# Patient Record
Sex: Female | Born: 1935 | Race: Black or African American | Hispanic: No | Smoking: Former smoker
Health system: Southern US, Community
[De-identification: ages and names within clinical notes are randomized; demographics above are authoritative.]

## PROBLEM LIST (undated history)

## (undated) DIAGNOSIS — N183 Chronic kidney disease, stage 3 unspecified: Secondary | ICD-10-CM

## (undated) DIAGNOSIS — R7303 Prediabetes: Secondary | ICD-10-CM

## (undated) DIAGNOSIS — J4520 Mild intermittent asthma, uncomplicated: Secondary | ICD-10-CM

## (undated) DIAGNOSIS — R0600 Dyspnea, unspecified: Secondary | ICD-10-CM

## (undated) DIAGNOSIS — R413 Other amnesia: Secondary | ICD-10-CM

## (undated) DIAGNOSIS — F0390 Unspecified dementia without behavioral disturbance: Secondary | ICD-10-CM

## (undated) DIAGNOSIS — N039 Chronic nephritic syndrome with unspecified morphologic changes: Secondary | ICD-10-CM

## (undated) DIAGNOSIS — M1990 Unspecified osteoarthritis, unspecified site: Secondary | ICD-10-CM

## (undated) DIAGNOSIS — E209 Hypoparathyroidism, unspecified: Secondary | ICD-10-CM

## (undated) DIAGNOSIS — E785 Hyperlipidemia, unspecified: Secondary | ICD-10-CM

## (undated) DIAGNOSIS — E559 Vitamin D deficiency, unspecified: Secondary | ICD-10-CM

## (undated) DIAGNOSIS — I10 Essential (primary) hypertension: Secondary | ICD-10-CM

## (undated) DIAGNOSIS — Z87442 Personal history of urinary calculi: Secondary | ICD-10-CM

## (undated) HISTORY — DX: Prediabetes: R73.03

## (undated) HISTORY — DX: Chronic nephritic syndrome with unspecified morphologic changes: N03.9

## (undated) HISTORY — PX: REPLACEMENT TOTAL KNEE: SUR1224

## (undated) HISTORY — DX: Hyperlipidemia, unspecified: E78.5

## (undated) HISTORY — DX: Mild intermittent asthma, uncomplicated: J45.20

## (undated) HISTORY — DX: Vitamin D deficiency, unspecified: E55.9

## (undated) HISTORY — PX: EYE SURGERY: SHX253

## (undated) HISTORY — DX: Essential (primary) hypertension: I10

## (undated) HISTORY — DX: Hypoparathyroidism, unspecified: E20.9

## (undated) HISTORY — PX: PARTIAL HYSTERECTOMY: SHX80

## (undated) HISTORY — DX: Other amnesia: R41.3

## (undated) HISTORY — DX: Chronic kidney disease, stage 3 unspecified: N18.30

## (undated) HISTORY — DX: Chronic kidney disease, stage 3 (moderate): N18.3

---

## 2017-10-26 LAB — HEMOGLOBIN A1C: Hemoglobin A1C: 5.9

## 2017-12-28 LAB — MICROALBUMIN/CREATININE RATIO, UR
Creatinine, Ur: 72 mg/dL
Microalb Creat Ratio: 418
Microalbumin, Urine: 30.1

## 2017-12-28 LAB — COMPREHENSIVE METABOLIC PANEL
ALT: 12 (ref 3–30)
AST: 16
Albumin, Serum: 4
Albumin/Globulin Ratio: 1.3
Alkaline Phosphatase, S: 73
BUN: 15 (ref 4–21)
Bilirubin: 0.3
Calcium: 8.9
Carbon Dioxide, Total: 27
Chloride: 107
EGFR (African American): 38
Globulin, Total: 3.1
Glucose: 95
Potassium: 4.1
Protein: 7.1
Sodium: 143

## 2017-12-28 LAB — LIPID PANEL
Chol/HDL Ratio, serum: 3
Cholesterol, Total: 117
HDL Cholesterol: 39 (ref 35–70)
LDL Cholesterol (Calc): 55
TRIGLYCERIDES: 149 (ref 40–160)

## 2017-12-28 LAB — VITAMIN D 25-HYDROXY, D2 + D3: Vit D, 25-Hydroxy: 28

## 2017-12-28 LAB — MICROALBUMIN, URINE: Microalb, Ur: 30.1

## 2018-05-16 LAB — BASIC METABOLIC PANEL
BUN/Creatinine Ratio: 11
BUN: 22 — AB (ref 4–21)
Calcium: 9
Carbon Dioxide, Total: 27
Chloride: 101
Creatine, Serum: 1.93
EGFR (African American): 27
Glucose: 175
Potassium: 3.8
Sodium: 138

## 2018-05-29 ENCOUNTER — Encounter: Payer: Self-pay | Admitting: Family Medicine

## 2018-05-29 ENCOUNTER — Ambulatory Visit (INDEPENDENT_AMBULATORY_CARE_PROVIDER_SITE_OTHER): Payer: Medicare Other | Admitting: Family Medicine

## 2018-05-29 VITALS — BP 119/56 | HR 63 | Resp 17 | Ht 60.0 in | Wt 173.4 lb

## 2018-05-29 DIAGNOSIS — Z7689 Persons encountering health services in other specified circumstances: Secondary | ICD-10-CM

## 2018-05-29 DIAGNOSIS — R350 Frequency of micturition: Secondary | ICD-10-CM | POA: Diagnosis not present

## 2018-05-29 DIAGNOSIS — F0390 Unspecified dementia without behavioral disturbance: Secondary | ICD-10-CM

## 2018-05-29 DIAGNOSIS — I10 Essential (primary) hypertension: Secondary | ICD-10-CM | POA: Diagnosis not present

## 2018-05-29 LAB — POCT URINALYSIS DIP (CLINITEK)
Bilirubin, UA: NEGATIVE
Glucose, UA: NEGATIVE mg/dL
NITRITE UA: NEGATIVE
POC PROTEIN,UA: 100 — AB
RBC UA: NEGATIVE
Spec Grav, UA: 1.02 (ref 1.010–1.025)
Urobilinogen, UA: 1 E.U./dL
pH, UA: 6 (ref 5.0–8.0)

## 2018-05-29 NOTE — Patient Instructions (Addendum)
Thank you for choosing Primary Care at [facility] to be your medical home!    Glenda Garcia was seen by Molli Barrows, FNP today.   Glenda Garcia's primary care provider is Scot Jun, FNP.   For the best care possible, you should try to see Molli Barrows, FNP-C whenever you come to the clinic.   We look forward to seeing you again soon!  If you have any questions about your visit today, please call us at [phone] or feel free to reach your primary care provider via [place].     Discontinue lisinopril given new onset tongue swelling and dry cough. Continue all other medications as prescribed. Notify me here at the office of blood pressure increases to greater than 140/90.  I will see back in office in 2 weeks.

## 2018-05-29 NOTE — Progress Notes (Signed)
New Patient Office Visit  Subjective:  Patient ID: Glenda Garcia, female    DOB: 29-May-1934  Age: 84 y.o. MRN: [id number]  CC:  Chief Complaint  Patient presents with  . Establish Care  . Urinary Frequency    daughter has noticed urinary frequency since moving here. patient denies any pain, odor, nausea, vomiting    HPI Glenda Garcia presents to establish care.  Patient was a walk-in today records accessible to be regarding her previous medical history.  She is accompanied today by her daughter in law reports patient has a history of Alzheimer's, kidney disease, cardiac conditions, high blood pressure, and hyperlipidemia.  She recently located to [facility] and was followed in our clinic in her previous home town in [place].  She has requested medical records to be sent to this office. She is concerned today that her mother's blood pressure has been running rather low and she is currently prescribed to blood pressure medications lisinopril being the newest medication added.  She is also concerned as her mother has had increased urinary frequency and is requesting evaluation of UTI.  Daughter is employed restoring immunoblot mother's medical condition as she is only given to take over her care within the last couple weeks since her mother moved in with her here to [place].  Patient is also a poor historian given cognitive impairment.  Patient denies chest pain, shortness of breath, headache, weakness, productive cough, unintentional weight gain, dysuria, or CVA tenderness  Past Medical History:  Diagnosis Date  . Hyperlipidemia   . Hypertension   . Memory loss     Unable to obtain family history as daughter-in-law at today's visit and due to cognitive decline patient is unable to report family medical. Social History   Socioeconomic History  . Marital status: Widowed    Spouse name: Not on file  . Number of children: Not on file  . Years of education: Not on file   . Highest education level: Not on file  Occupational History  . Not on file  Social Needs  . Financial resource strain: Not on file  . Food insecurity:    Worry: Not on file    Inability: Not on file  . Transportation needs:    Medical: Not on file    Non-medical: Not on file  Tobacco Use  . Smoking status: Former Research scientist (life sciences)  . Smokeless tobacco: Never Used  Substance and Sexual Activity  . Alcohol use: Not Currently  . Drug use: Never  . Sexual activity: Not Currently  Lifestyle  . Physical activity:    Days per week: Not on file    Minutes per session: Not on file  . Stress: Not on file  Relationships  . Social connections:    Talks on phone: Not on file    Gets together: Not on file    Attends religious service: Not on file    Active member of club or organization: Not on file    Attends meetings of clubs or organizations: Not on file    Relationship status: Not on file  . Intimate partner violence:    Fear of current or ex partner: Not on file    Emotionally abused: Not on file    Physically abused: Not on file    Forced sexual activity: Not on file  Other Topics Concern  . Not on file  Social History Narrative  . Not on file    ROS Review of Systems Pertinent negatives indicated  in HPI Objective:   Today's Vitals: BP (!) 119/56   Pulse 63   Resp 17   Ht 5' (1.524 m)   Wt 173 lb 6.4 oz (78.7 kg)   SpO2 95%   BMI 33.86 kg/m   Physical Exam General appearance: alert, well developed, well nourished, cooperative and in no distress Head: Normocephalic, without obvious abnormality, atraumatic Respiratory: Respirations even and unlabored, normal respiratory rate Extremities: No gross deformities, gait intact, strength intact 5/5 BUE and BLE  Skin: Skin color, texture, turgor normal. No rashes seen  Psych: Appropriate mood and affect. Neurologic: Mental status: Alert, oriented to person, place, and time, thought content appropriate. Assessment & Plan:    Problem List Items Addressed This Visit    None    Visit Diagnoses    Encounter to establish care    -  Primary   Urine frequency    will check a UA to rule out UTI.   Relevant Orders   POCT URINALYSIS DIP (CLINITEK) (Completed)   Urine Culture (Completed)   Dementia without behavioral disturbance, unspecified dementia type ([place])       Scheduled to follow-up with neurology in 1 month  relevant Medications   donepezil (ARICEPT ODT) 5 MG disintegrating tablet   Essential hypertension   , controlled today. Family reports the blood pressure has ran low with home readings.  Will temporarily DC lisinopril until previous medical records are received and reviewed.  Advised family to continue monitoring blood pressure at home.   Relevant Medications   furosemide (LASIX) 40 MG tablet   simvastatin (ZOCOR) 10 MG tablet   metoprolol tartrate (LOPRESSOR) 25 MG tablet      Outpatient Encounter Medications as of 05/29/2018  Medication Sig  . donepezil (ARICEPT ODT) 5 MG disintegrating tablet Take 1 tablet by mouth at bedtime.  . furosemide (LASIX) 40 MG tablet Take 20 mg by mouth every morning.  . metoprolol tartrate (LOPRESSOR) 25 MG tablet Take 25 mg by mouth daily.  . simvastatin (ZOCOR) 10 MG tablet Take 10 mg by mouth every evening.  . [DISCONTINUED] lisinopril (PRINIVIL,ZESTRIL) 5 MG tablet Take 5 mg by mouth daily.   No facility-administered encounter medications on file as of 05/29/2018.     Patient and family member advised to please make efforts to obtain medical records prior to scheduled follow-up so that appropriate medical decision making can be made.  Patient will need referrals to specialty she already has a follow-up an appointment scheduled with Dr. Carlyon Shadow.  Will review medical records and place referral after records have been received and reviewed.  Follow-up: Return in about 2 weeks (around 06/12/2018) for follow-up on medical condition after review of medical records .   Molli Barrows, FNP Primary Care at [facility] [address] [place] [phone]: [phone]

## 2018-05-31 LAB — URINE CULTURE

## 2018-06-01 ENCOUNTER — Telehealth: Payer: Self-pay | Admitting: Family Medicine

## 2018-06-01 NOTE — Telephone Encounter (Signed)
Please advise patient/patient daughter that I need to review medical records labs etc. Prior to placing a  Referral as I must have a history attached to the referral.   Molli Barrows, FNP Primary Care at [facility] [address], [place] [phone]: [phone]

## 2018-06-01 NOTE — Telephone Encounter (Signed)
Please advise 

## 2018-06-01 NOTE — Telephone Encounter (Signed)
Daughter called requesting a referral for her mother to see a nephrologist, stated the one she normally sees is too far. Please follow up

## 2018-06-04 DIAGNOSIS — I10 Essential (primary) hypertension: Secondary | ICD-10-CM | POA: Insufficient documentation

## 2018-06-04 DIAGNOSIS — F0390 Unspecified dementia without behavioral disturbance: Secondary | ICD-10-CM | POA: Insufficient documentation

## 2018-06-12 ENCOUNTER — Encounter: Payer: Self-pay | Admitting: Family Medicine

## 2018-06-12 ENCOUNTER — Ambulatory Visit (INDEPENDENT_AMBULATORY_CARE_PROVIDER_SITE_OTHER): Payer: Medicare Other | Admitting: Family Medicine

## 2018-06-12 VITALS — BP 149/70 | HR 67 | Resp 17 | Ht 60.0 in | Wt 175.8 lb

## 2018-06-12 DIAGNOSIS — Z1321 Encounter for screening for nutritional disorder: Secondary | ICD-10-CM

## 2018-06-12 DIAGNOSIS — I10 Essential (primary) hypertension: Secondary | ICD-10-CM

## 2018-06-12 DIAGNOSIS — N184 Chronic kidney disease, stage 4 (severe): Secondary | ICD-10-CM | POA: Diagnosis not present

## 2018-06-12 DIAGNOSIS — I129 Hypertensive chronic kidney disease with stage 1 through stage 4 chronic kidney disease, or unspecified chronic kidney disease: Secondary | ICD-10-CM

## 2018-06-12 DIAGNOSIS — E559 Vitamin D deficiency, unspecified: Secondary | ICD-10-CM | POA: Diagnosis not present

## 2018-06-12 DIAGNOSIS — E7841 Elevated Lipoprotein(a): Secondary | ICD-10-CM

## 2018-06-12 DIAGNOSIS — R7303 Prediabetes: Secondary | ICD-10-CM | POA: Diagnosis not present

## 2018-06-12 MED ORDER — ALBUTEROL SULFATE HFA 108 (90 BASE) MCG/ACT IN AERS: 2.0000 | INHALATION_SPRAY | Freq: Four times a day (QID) | RESPIRATORY_TRACT | 0 refills | Status: DC | PRN

## 2018-06-12 MED ORDER — SIMVASTATIN 10 MG PO TABS: 10.0000 mg | ORAL_TABLET | Freq: Every day | ORAL | 1 refills | Status: DC

## 2018-06-12 MED ORDER — FUROSEMIDE 40 MG PO TABS: 20.0000 mg | ORAL_TABLET | Freq: Every morning | ORAL | 5 refills | Status: DC

## 2018-06-12 MED ORDER — AMLODIPINE BESYLATE 2.5 MG PO TABS: 2.5000 mg | ORAL_TABLET | Freq: Every day | ORAL | 3 refills | Status: DC

## 2018-06-12 NOTE — Progress Notes (Signed)
Established Patient Office Visit  Subjective:  Patient ID: Glenda Garcia, female    DOB: [birth year]  Age: 84 y.o. MRN: [id number]  CC:  Chief Complaint  Patient presents with  . Medical Management of Chronic Issues    HPI Glenda Garcia presents for evaluation of chronic condition. Patient was seen and established two weeks prior. She did not have medical records available with her during that visit. She returns today to follow-up.  During last office visit, family was concerned regarding patient's low blood pressure readings.  During her last office visit with her previous PCP lisinopril was added to her regimen.  Lisinopril was this continued at her last office visit and today she presents with an elevated blood pressure reading although has not taken any blood pressure medications today.  Of more concern her heart rate remains to cardiac without the metoprolol she is prescribed for high blood pressure. She is in need of a nephrology referral. She has been followed nephrology in [facility] for CKD 3. Her most recent lab results indicate a creatinine level of 1.93 and a BUN of 22.  GFR was 27.   She suffers from a cognitive decline with the memory deficit and recently was notified that she was no longer able to drive.  She was taking donepezil however recently stopped taking the medication due to some irritability.  Patient has a follow-up scheduled with neurology next month defer to them to resume medication.  Patient is cognitively stable  and asking and answering questions appropriately during today's visit.  Past Medical History:  Diagnosis Date  . CKD (chronic kidney disease), stage III ([place])   . Glomerulonephritis, chronic   . Hyperlipidemia   . Hypertension   . Hypoparathyroidism ([place])   . Memory loss   . Mild intermittent asthma   . Prediabetes   . Vitamin D deficiency      Family History  Problem Relation Age of Onset  . Hypertension Mother   .  Cancer Father   . Healthy Sister   . Healthy Son   . Early death Son        MVA  . Unexplained death Brother   . Unexplained death Brother   . Early death Sister        died at 47 months    Social History   Socioeconomic History  . Marital status: Widowed    Spouse name: Not on file  . Number of children: Not on file  . Years of education: Not on file  . Highest education level: Not on file  Occupational History  . Not on file  Social Needs  . Financial resource strain: Not on file  . Food insecurity:    Worry: Not on file    Inability: Not on file  . Transportation needs:    Medical: Not on file    Non-medical: Not on file  Tobacco Use  . Smoking status: Former Research scientist (life sciences)  . Smokeless tobacco: Former Systems developer    Types: Snuff  Substance and Sexual Activity  . Alcohol use: Not Currently  . Drug use: Never  . Sexual activity: Not Currently  Lifestyle  . Physical activity:    Days per week: Not on file    Minutes per session: Not on file  . Stress: Not on file  Relationships  . Social connections:    Talks on phone: Not on file    Gets together: Not on file    Attends religious service: Not  on file    Active member of club or organization: Not on file    Attends meetings of clubs or organizations: Not on file    Relationship status: Not on file  . Intimate partner violence:    Fear of current or ex partner: Not on file    Emotionally abused: Not on file    Physically abused: Not on file    Forced sexual activity: Not on file  Other Topics Concern  . Not on file  Social History Narrative  . Not on file    Outpatient Medications Prior to Visit  Medication Sig Dispense Refill  . furosemide (LASIX) 40 MG tablet Take 20 mg by mouth every morning.  5  . metoprolol tartrate (LOPRESSOR) 25 MG tablet Take 25 mg by mouth daily.    . simvastatin (ZOCOR) 10 MG tablet Take 10 mg by mouth every evening.  4  . donepezil (ARICEPT ODT) 5 MG disintegrating tablet Take 1 tablet by  mouth at bedtime.  5   No facility-administered medications prior to visit.     No Known Allergies  ROS Review of Systems Pertinent negatives listed in HPI   Objective:    Physical Exam  BP (!) 159/75   Pulse (!) 54   Resp 17   Ht 5' (1.524 m)   Wt 175 lb 12.8 oz (79.7 kg)   SpO2 95%   BMI 34.33 kg/m  Wt Readings from Last 3 Encounters:  06/12/18 175 lb 12.8 oz (79.7 kg)  05/29/18 173 lb 6.4 oz (78.7 kg)    Physical Exam: Constitutional: Patient appears well-developed and well-nourished. No distress. HENT: Normocephalic, atraumatic, External right and left ear normal. Oropharynx is clear and moist.  Eyes: Conjunctivae and EOM are normal. PERRLA, no scleral icterus. Neck: Normal ROM. Neck supple. No JVD. No tracheal deviation. No thyromegaly. CVS: Bradycardia,  S1/S2 +, no murmurs, no gallops, no carotid bruit.  Pulmonary: Effort and breath sounds normal, no stridor, rhonchi, wheezes, rales.  Abdominal: Soft. BS +, no distension, tenderness, rebound or guarding.  Musculoskeletal: Normal range of motion. No edema and no tenderness.  Neuro: Alert. Normal reflexes, muscle tone coordination. No cranial nerve deficit. Skin: Skin is warm and dry. No rash noted. Not diaphoretic. No erythema. No pallor. Psychiatric: Normal mood and affect. Behavior, judgment, thought content normal.   Health Maintenance Due  Topic Date Due  . TETANUS/TDAP  01/21/1955  . DEXA SCAN  01/20/2001  . PNA vac Low Risk Adult (1 of 2 - PCV13) 01/20/2001    There are no preventive care reminders to display for this patient.  No results found for: TSH No results found for: WBC, HGB, HCT, MCV, PLT Lab Results  Component Value Date   NA 138 05/16/2018   K 3.8 05/16/2018   CO2 27 05/16/2018   BUN 22 (A) 05/16/2018   ALKPHOS 73 12/28/2017   AST 16 12/28/2017   ALT 12 12/28/2017   ALBUMIN 4.0 12/28/2017   CALCIUM 9.0 05/16/2018   Lab Results  Component Value Date   CHOL 117 12/28/2017    Lab Results  Component Value Date   HDL 39 12/28/2017   Lab Results  Component Value Date   LDLCALC 55 12/28/2017   Lab Results  Component Value Date   TRIG 149 12/28/2017   Lab Results  Component Value Date   CHOLHDL 3.0 12/28/2017   Lab Results  Component Value Date   HGBA1C 5.9 10/26/2017      Assessment &  Plan:  1. Essential hypertension, elevated today however have not taken home meds. Given heart rate today without medication I am discontinuing metoprolol as there is good options available for blood pressure management.  Will start amlodipine 2.5 mg once daily. We will have patient follow-up in 3 weeks for blood pressure recheck and pulse check. Continue to encourage to minimize intake of sodium as this impacts blood pressure.  2. Elevated lipoprotein(a) Checking a fasting lipid panel today along with a TSH level.  3. Vitamin D deficiency Patient had a previously low vitamin D level this level has not been rechecked is completing weekly iron replacement therapy. - Vitamin D, 25-hydroxy  4. Prediabetes,  Last A1c was 5.9 in July.  We will recheck a A1c today.  Has remained stable per previous medical records without medication. Checking the following - Hemoglobin T6R - Basic metabolic panel  5. CKD stage 4 secondary to hypertension [facility]) Patient has a history of some chronic anemia likely secondary to chronic disease we will recheck a CBC today. Referral placed to nephrology - Ambulatory referral to Nephrology   Meds ordered this encounter  Medications  . amLODipine (NORVASC) 2.5 MG tablet    Sig: Take 1 tablet (2.5 mg total) by mouth daily.    Dispense:  90 tablet    Refill:  3  . albuterol (PROVENTIL HFA;VENTOLIN HFA) 108 (90 Base) MCG/ACT inhaler    Sig: Inhale 2 puffs into the lungs every 6 (six) hours as needed for wheezing or shortness of breath.    Dispense:  1 Inhaler    Refill:  0  . simvastatin (ZOCOR) 10 MG tablet    Sig: Take 1 tablet (10  mg total) by mouth daily at 6 PM.    Dispense:  90 tablet    Refill:  1  . furosemide (LASIX) 40 MG tablet    Sig: Take 0.5 tablets (20 mg total) by mouth every morning.    Dispense:  30 tablet    Refill:  5    Follow-up: Return 3 weeks BP and pulse check and 2 months with me .    Molli Barrows, FNP

## 2018-06-12 NOTE — Patient Instructions (Addendum)
Review medication list attached and take all medications as prescribed today.   -I am discontinuing metoprolol due to your pulse rate is low.  You have hypertension, therefore medication is needed to control your blood pressure.  I am starting you on Amlodipine 2.5 mg once daily. Continue Lasix 20 mg once daily.  Return in 3 weeks for blood pressure check.   I have refilled your prescription for albuterol in the event you need an inhaler.  I will allow neurology to evaluate the need for Donepezil therefore if she is not taking, do not resume.    Hypertension Hypertension is another name for high blood pressure. High blood pressure forces your heart to work harder to pump blood. This can cause problems over time. There are two numbers in a blood pressure reading. There is a top number (systolic) over a bottom number (diastolic). It is best to have a blood pressure below 120/80. Healthy choices can help lower your blood pressure. You may need medicine to help lower your blood pressure if:  Your blood pressure cannot be lowered with healthy choices.  Your blood pressure is higher than 130/80.  Follow these instructions at home: Eating and drinking  If directed, follow the DASH eating plan. This diet includes: ? Filling half of your plate at each meal with fruits and vegetables. ? Filling one quarter of your plate at each meal with whole grains. Whole grains include whole wheat pasta, brown rice, and whole grain bread. ? Eating or drinking low-fat dairy products, such as skim milk or low-fat yogurt. ? Filling one quarter of your plate at each meal with low-fat (lean) proteins. Low-fat proteins include fish, skinless chicken, eggs, beans, and tofu. ? Avoiding fatty meat, cured and processed meat, or chicken with skin. ? Avoiding premade or processed food.  Eat less than 1,500 mg of salt (sodium) a day.  Limit alcohol use to no more than 1 drink a day for nonpregnant women and 2 drinks  a day for men. One drink equals 12 oz of beer, 5 oz of wine, or 1 oz of hard liquor. Lifestyle  Work with your doctor to stay at a healthy weight or to lose weight. Ask your doctor what the best weight is for you.  Get at least 30 minutes of exercise that causes your heart to beat faster (aerobic exercise) most days of the week. This may include walking, swimming, or biking.  Get at least 30 minutes of exercise that strengthens your muscles (resistance exercise) at least 3 days a week. This may include lifting weights or pilates.  Do not use any products that contain nicotine or tobacco. This includes cigarettes and e-cigarettes. If you need help quitting, ask your doctor.  Check your blood pressure at home as told by your doctor.  Keep all follow-up visits as told by your doctor. This is important. Medicines  Take over-the-counter and prescription medicines only as told by your doctor. Follow directions carefully.  Do not skip doses of blood pressure medicine. The medicine does not work as well if you skip doses. Skipping doses also puts you at risk for problems.  Ask your doctor about side effects or reactions to medicines that you should watch for. Contact a doctor if:  You think you are having a reaction to the medicine you are taking.  You have headaches that keep coming back (recurring).  You feel dizzy.  You have swelling in your ankles.  You have trouble with your vision. Get help  right away if:  You get a very bad headache.  You start to feel confused.  You feel weak or numb.  You feel faint.  You get very bad pain in your: ? Chest. ? Belly (abdomen).  You throw up (vomit) more than once.  You have trouble breathing. Summary  Hypertension is another name for high blood pressure.  Making healthy choices can help lower blood pressure. If your blood pressure cannot be controlled with healthy choices, you may need to take medicine. This information is not  intended to replace advice given to you by your health care provider. Make sure you discuss any questions you have with your health care provider. Document Released: 12/01/2007 Document Revised: 05/12/2016 Document Reviewed: 05/12/2016 Elsevier Interactive Patient Education  [employer].

## 2018-06-13 LAB — CBC WITH DIFFERENTIAL/PLATELET
Basophils Absolute: 0.1 10*3/uL (ref 0.0–0.2)
Basos: 1 %
EOS (ABSOLUTE): 0.2 10*3/uL (ref 0.0–0.4)
Eos: 2 %
Hematocrit: 44.3 % (ref 34.0–46.6)
Hemoglobin: 14.5 g/dL (ref 11.1–15.9)
Immature Grans (Abs): 0 10*3/uL (ref 0.0–0.1)
Immature Granulocytes: 0 %
Lymphocytes Absolute: 2.3 10*3/uL (ref 0.7–3.1)
Lymphs: 25 %
MCH: 29.5 pg (ref 26.6–33.0)
MCHC: 32.7 g/dL (ref 31.5–35.7)
MCV: 90 fL (ref 79–97)
Monocytes Absolute: 0.5 10*3/uL (ref 0.1–0.9)
Monocytes: 5 %
Neutrophils Absolute: 6.2 10*3/uL (ref 1.4–7.0)
Neutrophils: 67 %
Platelets: 215 10*3/uL (ref 150–450)
RBC: 4.92 x10E6/uL (ref 3.77–5.28)
RDW: 13.4 % (ref 12.3–15.4)
WBC: 9.3 10*3/uL (ref 3.4–10.8)

## 2018-06-13 LAB — LIPID PANEL
CHOLESTEROL TOTAL: 203 mg/dL — AB (ref 100–199)
Chol/HDL Ratio: 4.2 ratio (ref 0.0–4.4)
HDL: 48 mg/dL (ref >39)
LDL Calculated: 102 mg/dL — ABNORMAL HIGH (ref 0–99)
Triglycerides: 265 mg/dL — ABNORMAL HIGH (ref 0–149)
VLDL Cholesterol Cal: 53 mg/dL — ABNORMAL HIGH (ref 5–40)

## 2018-06-13 LAB — BASIC METABOLIC PANEL
BUN/Creatinine Ratio: 17 (ref 12–28)
BUN: 33 mg/dL — ABNORMAL HIGH (ref 8–27)
CO2: 20 mmol/L (ref 20–29)
Calcium: 10 mg/dL (ref 8.7–10.3)
Chloride: 100 mmol/L (ref 96–106)
Creatinine, Ser: 1.91 mg/dL — ABNORMAL HIGH (ref 0.57–1.00)
GFR calc Af Amer: 28 mL/min/{1.73_m2} — ABNORMAL LOW (ref >59)
GFR calc non Af Amer: 24 mL/min/{1.73_m2} — ABNORMAL LOW (ref >59)
Glucose: 93 mg/dL (ref 65–99)
Potassium: 5 mmol/L (ref 3.5–5.2)
Sodium: 141 mmol/L (ref 134–144)

## 2018-06-13 LAB — HEMOGLOBIN A1C
Est. average glucose Bld gHb Est-mCnc: 128 mg/dL
Hgb A1c MFr Bld: 6.1 % — ABNORMAL HIGH (ref 4.8–5.6)

## 2018-06-13 LAB — VITAMIN D 25 HYDROXY (VIT D DEFICIENCY, FRACTURES): Vit D, 25-Hydroxy: 19.1 ng/mL — ABNORMAL LOW (ref 30.0–100.0)

## 2018-06-13 LAB — TSH: TSH: 0.758 u[IU]/mL (ref 0.450–4.500)

## 2018-06-14 MED ORDER — VITAMIN D (ERGOCALCIFEROL) 1.25 MG (50000 UNIT) PO CAPS: 50000.0000 [IU] | ORAL_CAPSULE | ORAL | 0 refills | Status: DC

## 2018-06-14 NOTE — Addendum Note (Signed)
Addended by: Scot Jun on: 06/14/2018 12:36 PM   Modules accepted: Orders

## 2018-06-14 NOTE — Progress Notes (Signed)
Patient's daughter notified of results & recommendations. Expressed understanding. States that she has restarted patient on her Statin medication. Patient wasn't taking all of her medications or eating properly prior to relocating. I also updated chart with their local pharmacy so you can send the Vitamin D Rx over.

## 2018-07-03 ENCOUNTER — Ambulatory Visit (INDEPENDENT_AMBULATORY_CARE_PROVIDER_SITE_OTHER): Payer: Medicare Other

## 2018-07-03 VITALS — BP 122/68 | HR 82

## 2018-07-03 DIAGNOSIS — I10 Essential (primary) hypertension: Secondary | ICD-10-CM | POA: Diagnosis not present

## 2018-07-03 DIAGNOSIS — Z0130 Encounter for examination of blood pressure without abnormal findings: Secondary | ICD-10-CM | POA: Diagnosis not present

## 2018-07-03 NOTE — Progress Notes (Signed)
Patient here for BP check. BP 122/68. Pulse 82. Spoke with provider & she states to continue current medications & keep scheduled appointment in February. KWalker, CMA.

## 2018-07-24 ENCOUNTER — Telehealth: Payer: Self-pay | Admitting: Diagnostic Neuroimaging

## 2018-07-24 ENCOUNTER — Encounter: Payer: Self-pay | Admitting: Diagnostic Neuroimaging

## 2018-07-24 ENCOUNTER — Ambulatory Visit (INDEPENDENT_AMBULATORY_CARE_PROVIDER_SITE_OTHER): Payer: Medicare Other | Admitting: Diagnostic Neuroimaging

## 2018-07-24 VITALS — BP 139/90 | HR 116 | Ht 60.0 in | Wt 181.0 lb

## 2018-07-24 DIAGNOSIS — R413 Other amnesia: Secondary | ICD-10-CM

## 2018-07-24 DIAGNOSIS — F0390 Unspecified dementia without behavioral disturbance: Secondary | ICD-10-CM | POA: Diagnosis not present

## 2018-07-24 DIAGNOSIS — F03A0 Unspecified dementia, mild, without behavioral disturbance, psychotic disturbance, mood disturbance, and anxiety: Secondary | ICD-10-CM

## 2018-07-24 NOTE — Telephone Encounter (Signed)
Medicare order sent to GI pt daughter in law is aware on dpr.

## 2018-07-24 NOTE — Progress Notes (Signed)
[facility]  PATIENT: Glenda Garcia DOB: 1933/08/05  REFERRING CLINICIAN: C Boehman HISTORY FROM: patient and daughter in law REASON FOR VISIT: new consult    HISTORICAL  CHIEF COMPLAINT:  Chief Complaint  Patient presents with  . New Patient (Initial Visit)    Referred by NP Virgina Evener  . Memory Loss    Rm 7, daughter in law, Health visitor. Given aricept, but was not able to take due to SE    HISTORY OF PRESENT ILLNESS:   85 year old female here for evaluation of memory loss.  Patient was living in [facility], independently, when her PCP noted some memory loss problems.  She contacted family lives in [place] that they should, and take care of her.  Mild dementia was suspected.  Patient moved to [facility] in October 2019.  Family has noted additional short-term memory loss problems.  Patient has had significant decline in activities of daily living.  She needs assistance with shopping, transportation, bills, medications.  She is not able to function independently outside of the home.  Patient tried on donepezil but family noted increasing mood changes and lability, and therefore this medication was stopped.   REVIEW OF SYSTEMS: Full 14 system review of systems performed and negative with exception of: Memory loss confusion feeling cold shortness of breath.  ALLERGIES: No Known Allergies  HOME MEDICATIONS: Outpatient Medications Prior to Visit  Medication Sig Dispense Refill  . albuterol (PROVENTIL HFA;VENTOLIN HFA) 108 (90 Base) MCG/ACT inhaler Inhale 2 puffs into the lungs every 6 (six) hours as needed for wheezing or shortness of breath. 1 Inhaler 0  . amLODipine (NORVASC) 2.5 MG tablet Take 1 tablet (2.5 mg total) by mouth daily. 90 tablet 3  . furosemide (LASIX) 40 MG tablet Take 0.5 tablets (20 mg total) by mouth every morning. 30 tablet 5  . simvastatin (ZOCOR) 10 MG tablet Take 1 tablet (10 mg total) by mouth daily at 6 PM. 90  tablet 1  . Vitamin D, Ergocalciferol, (DRISDOL) 1.25 MG (50000 UT) CAPS capsule Take 1 capsule (50,000 Units total) by mouth every 7 (seven) days. 12 capsule 0  . donepezil (ARICEPT ODT) 5 MG disintegrating tablet Take 1 tablet by mouth at bedtime.  5   No facility-administered medications prior to visit.     PAST MEDICAL HISTORY: Past Medical History:  Diagnosis Date  . CKD (chronic kidney disease), stage III ([place])   . Glomerulonephritis, chronic   . Hyperlipidemia   . Hypertension   . Hypoparathyroidism ([place])   . Memory loss   . Mild intermittent asthma   . Prediabetes   . Vitamin D deficiency     PAST SURGICAL HISTORY: Past Surgical History:  Procedure Laterality Date  . PARTIAL HYSTERECTOMY    . REPLACEMENT TOTAL KNEE Left     FAMILY HISTORY: Family History  Problem Relation Age of Onset  . Hypertension Mother   . Cancer Father   . Healthy Sister   . Healthy Son   . Early death Son        MVA  . Unexplained death Brother   . Unexplained death Brother   . Early death Sister        died at 41 months    SOCIAL HISTORY: Social History   Socioeconomic History  . Marital status: Widowed    Spouse name: Not on file  . Number of children: Not on file  . Years of education: Not on file  . Highest education level: Not  on file  Occupational History  . Not on file  Social Needs  . Financial resource strain: Not on file  . Food insecurity:    Worry: Not on file    Inability: Not on file  . Transportation needs:    Medical: Not on file    Non-medical: Not on file  Tobacco Use  . Smoking status: Former Research scientist (life sciences)  . Smokeless tobacco: Former Systems developer    Types: Snuff  Substance and Sexual Activity  . Alcohol use: Not Currently  . Drug use: Never  . Sexual activity: Not Currently  Lifestyle  . Physical activity:    Days per week: Not on file    Minutes per session: Not on file  . Stress: Not on file  Relationships  . Social connections:    Talks on phone: Not  on file    Gets together: Not on file    Attends religious service: Not on file    Active member of club or organization: Not on file    Attends meetings of clubs or organizations: Not on file    Relationship status: Not on file  . Intimate partner violence:    Fear of current or ex partner: Not on file    Emotionally abused: Not on file    Physically abused: Not on file    Forced sexual activity: Not on file  Other Topics Concern  . Not on file  Social History Narrative  . Not on file     PHYSICAL EXAM  GENERAL EXAM/CONSTITUTIONAL: Vitals:  Vitals:   07/24/18 1002  BP: 139/90  Pulse: (!) 116  Weight: 181 lb (82.1 kg)  Height: 5' (1.524 m)     Body mass index is 35.35 kg/m. Wt Readings from Last 3 Encounters:  07/24/18 181 lb (82.1 kg)  06/12/18 175 lb 12.8 oz (79.7 kg)  05/29/18 173 lb 6.4 oz (78.7 kg)     Patient is in no distress; well developed, nourished and groomed; neck is supple  CARDIOVASCULAR:  Examination of carotid arteries is normal; no carotid bruits  Regular rate and rhythm, no murmurs  Examination of peripheral vascular system by observation and palpation is normal  EYES:  Ophthalmoscopic exam of optic discs and posterior segments is normal; no papilledema or hemorrhages  Visual Acuity Screening   Right eye Left eye Both eyes  Without correction: 20/100 20/40   With correction:     Comments: Wears bifocals   MUSCULOSKELETAL:  Gait, strength, tone, movements noted in Neurologic exam below  NEUROLOGIC: MENTAL STATUS:  MMSE - Mini Mental State Exam 07/24/2018  Orientation to time 3  Orientation to Place 4  Registration 3  Attention/ Calculation 2  Recall 1  Language- name 2 objects 2  Language- repeat 1  Language- follow 3 step command 3  Language- read & follow direction 1  Write a sentence 1  Copy design 1  Total score 22    awake, alert, oriented to person, place and time  recent and remote memory intact  normal  attention and concentration  language fluent, comprehension intact, naming intact  fund of knowledge appropriate  MOOD FLUCTUATIONS  CRANIAL NERVE:   2nd - no papilledema on fundoscopic exam  2nd, 3rd, 4th, 6th - pupils equal and reactive to light, visual fields full to confrontation, extraocular muscles intact, no nystagmus  5th - facial sensation symmetric  7th - facial strength symmetric  8th - hearing intact  9th - palate elevates symmetrically, uvula midline  11th -  shoulder shrug symmetric  12th - tongue protrusion midline  MOTOR:   normal bulk and tone, full strength in the BUE, BLE  SENSORY:   normal and symmetric to light touch, temperature, vibration  COORDINATION:   finger-nose-finger, fine finger movements normal  REFLEXES:   deep tendon reflexes present and symmetric  GAIT/STATION:   narrow based gait     DIAGNOSTIC DATA (LABS, IMAGING, TESTING) - I reviewed patient records, labs, notes, testing and imaging myself where available.  Lab Results  Component Value Date   WBC 9.3 06/12/2018   HGB 14.5 06/12/2018   HCT 44.3 06/12/2018   MCV 90 06/12/2018   PLT 215 06/12/2018      Component Value Date/Time   NA 141 06/12/2018 0948   NA 138 05/16/2018   K 5.0 06/12/2018 0948   K 3.8 05/16/2018   CL 100 06/12/2018 0948   CL 101 05/16/2018   CO2 20 06/12/2018 0948   CO2 27 05/16/2018   GLUCOSE 93 06/12/2018 0948   BUN 33 (H) 06/12/2018 0948   CREATININE 1.91 (H) 06/12/2018 0948   CALCIUM 10.0 06/12/2018 0948   CALCIUM 9.0 05/16/2018   ALBUMIN 4.0 12/28/2017   AST 16 12/28/2017   ALT 12 12/28/2017   ALKPHOS 73 12/28/2017   GFRNONAA 24 (L) 06/12/2018 0948   GFRAA 28 (L) 06/12/2018 0948   GFRAA 27 05/16/2018   Lab Results  Component Value Date   CHOL 203 (H) 06/12/2018   HDL 48 06/12/2018   LDLCALC 102 (H) 06/12/2018   TRIG 265 (H) 06/12/2018   CHOLHDL 4.2 06/12/2018   Lab Results  Component Value Date   HGBA1C 6.1 (H)  06/12/2018   No results found for: [id number] Lab Results  Component Value Date   TSH 0.758 06/12/2018       ASSESSMENT AND PLAN  85 y.o. year old female here with gradual onset progressive short-term memory loss, confusion, mood lability, decline in ADLs, most consistent with neurodegenerative dementia.  Will check MRI of the brain to rule out other secondary causes.  Meds tried: donepezil (labile mood)  Dx:  1. Mild dementia ([place])   2. Memory loss     PLAN:  MILD DEMENTIA - check MRI brain - consider memantine 10mg  at bedtime; increase to twice a day after 1-2 weeks - consider sertraline 25mg  daily in future (for mood) - safety / supervision issues reviewed - caregiver resources provided - no driving; caution with finances and medications  Orders Placed This Encounter  Procedures  . MR BRAIN WO CONTRAST   Return in about 6 months (around 01/22/2019) for with NP (Amy Lomax).    Penni Bombard, MD 8/67/6195, 09:32 AM Certified in Neurology, Neurophysiology and Neuroimaging  [facility] [address], [place], [place] [address] [phone]

## 2018-07-24 NOTE — Patient Instructions (Signed)
  MILD DEMENTIA - check MRI brain - consider memantine 10mg  at bedtime; increase to twice a day after 1-2 weeks - consider sertraline 25mg  daily in future (for mood) - safety / supervision issues reviewed - caregiver resources provided - no driving; caution with finances and medications

## 2018-07-31 ENCOUNTER — Ambulatory Visit
Admission: RE | Admit: 2018-07-31 | Discharge: 2018-07-31 | Disposition: A | Discharge status: Home / Self Care | Payer: Medicare Other | Source: Ambulatory Visit | Attending: Diagnostic Neuroimaging | Admitting: Diagnostic Neuroimaging

## 2018-07-31 DIAGNOSIS — R413 Other amnesia: Secondary | ICD-10-CM

## 2018-08-01 ENCOUNTER — Telehealth: Payer: Self-pay | Admitting: Family Medicine

## 2018-08-01 NOTE — Telephone Encounter (Signed)
Daughter in law was calling because she would like a call back from the nurse. Please follow up.

## 2018-08-02 ENCOUNTER — Telehealth: Payer: Self-pay | Admitting: *Deleted

## 2018-08-02 ENCOUNTER — Telehealth: Payer: Self-pay | Admitting: Diagnostic Neuroimaging

## 2018-08-02 NOTE — Telephone Encounter (Signed)
I spoke with family (son and daughter in law) over phone. ADLs for patient have really progressed. Likely in mild-moderate stage of dementia. Reviewed diagnosis, prognosis and resources. Reviewed MRI results. Consider memantine and sertraline.     Penni Bombard, MD 10/27/6142, 3:24 PM Certified in Neurology, Neurophysiology and Neuroimaging  [facility] [address] [place], [place] [address] [phone]

## 2018-08-02 NOTE — Telephone Encounter (Signed)
LVM informing patient her MRI brain result is unremarkable, no acute findings. Left number for any questions.

## 2018-08-08 ENCOUNTER — Ambulatory Visit: Payer: Medicare Other | Admitting: Family Medicine

## 2018-08-08 NOTE — Telephone Encounter (Signed)
Patient's daughter was following up on MRI results. Advised her to contact Neuro.

## 2018-08-14 ENCOUNTER — Ambulatory Visit: Payer: Medicare Other | Admitting: Family Medicine

## 2018-08-15 ENCOUNTER — Ambulatory Visit: Payer: Medicare Other | Admitting: Family Medicine

## 2018-08-28 DIAGNOSIS — H2511 Age-related nuclear cataract, right eye: Secondary | ICD-10-CM | POA: Diagnosis not present

## 2018-08-28 DIAGNOSIS — H25811 Combined forms of age-related cataract, right eye: Secondary | ICD-10-CM | POA: Diagnosis not present

## 2018-08-28 DIAGNOSIS — H21561 Pupillary abnormality, right eye: Secondary | ICD-10-CM | POA: Diagnosis not present

## 2018-08-30 ENCOUNTER — Ambulatory Visit (INDEPENDENT_AMBULATORY_CARE_PROVIDER_SITE_OTHER): Payer: Medicare HMO | Admitting: Family Medicine

## 2018-08-30 ENCOUNTER — Encounter: Payer: Self-pay | Admitting: Family Medicine

## 2018-08-30 VITALS — BP 145/76 | HR 95 | Resp 17 | Ht 60.0 in | Wt 185.0 lb

## 2018-08-30 DIAGNOSIS — Z9889 Other specified postprocedural states: Secondary | ICD-10-CM | POA: Diagnosis not present

## 2018-08-30 DIAGNOSIS — N183 Chronic kidney disease, stage 3 unspecified: Secondary | ICD-10-CM

## 2018-08-30 DIAGNOSIS — E559 Vitamin D deficiency, unspecified: Secondary | ICD-10-CM | POA: Diagnosis not present

## 2018-08-30 DIAGNOSIS — I10 Essential (primary) hypertension: Secondary | ICD-10-CM

## 2018-08-30 DIAGNOSIS — Z1389 Encounter for screening for other disorder: Secondary | ICD-10-CM

## 2018-08-30 DIAGNOSIS — Z23 Encounter for immunization: Secondary | ICD-10-CM | POA: Diagnosis not present

## 2018-08-30 LAB — POCT URINALYSIS DIP (CLINITEK)
Bilirubin, UA: NEGATIVE
Glucose, UA: NEGATIVE mg/dL
Ketones, POC UA: NEGATIVE mg/dL
Nitrite, UA: NEGATIVE
PH UA: 6 (ref 5.0–8.0)
POC PROTEIN,UA: NEGATIVE
RBC UA: NEGATIVE
Spec Grav, UA: 1.015 (ref 1.010–1.025)
Urobilinogen, UA: 0.2 E.U./dL

## 2018-08-30 MED ORDER — AMLODIPINE BESYLATE 5 MG PO TABS: 5.0000 mg | ORAL_TABLET | Freq: Every day | ORAL | 1 refills | Status: DC

## 2018-08-30 NOTE — Progress Notes (Signed)
Patient ID: Glenda Garcia, female    DOB: 1934-02-27, 85 y.o.   MRN: [id number]  PCP: Scot Jun, FNP  Chief Complaint  Patient presents with  . Hypertension  . Hyperlipidemia    Subjective:  HPI  Glenda Garcia is a 85 y.o. female presents for evaluation of hypertension. Patient's daughter in law endorses measuring blood pressure at home 1-2 times daily. She is a former Dietitian and indicates blood pressure have been rather high since his last office visit.  Daughter reports BP readings have ranged between 893-734 systolic. She is complaint with medications. Family has assisted her in making significant dietary changes such as low-sodium diet, eliminating caffeine such as sodas daily, and reducing sugar.  Patient has started at home senior exercise program which she does for at minimum 15 to 30 minutes/day.  She is also playing games to help preserve her memory.  He has remained in good spirits and family has made efforts to allow her to retain as much as her independence as possible.  She resides with her son and daughter-in-law.  Daughter-in-law is the primary caregiver.  They are looking to have home health come in once or twice per week for respite care.  She denies any chest pain, shortness of breath, new weakness.  She is status post right eye surgery, Dr. Katy Fitch.  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  Patient has not had any complications since surgery.  Has any headache, pressure behind the eye, dizziness, or feeling off balance. Social History   Socioeconomic History  . Marital status: Widowed    Spouse name: Not on file  . Number of children: Not on file  . Years of education: Not on file  . Highest education level: Not on file  Occupational History  . Not on file  Social Needs  . Financial resource strain: Not on file  . Food insecurity:    Worry: Not on file    Inability: Not on file  . Transportation needs:    Medical:  Not on file    Non-medical: Not on file  Tobacco Use  . Smoking status: Former Research scientist (life sciences)  . Smokeless tobacco: Former Systems developer    Types: Snuff  Substance and Sexual Activity  . Alcohol use: Not Currently  . Drug use: Never  . Sexual activity: Not Currently  Lifestyle  . Physical activity:    Days per week: Not on file    Minutes per session: Not on file  . Stress: Not on file  Relationships  . Social connections:    Talks on phone: Not on file    Gets together: Not on file    Attends religious service: Not on file    Active member of club or organization: Not on file    Attends meetings of clubs or organizations: Not on file    Relationship status: Not on file  . Intimate partner violence:    Fear of current or ex partner: Not on file    Emotionally abused: Not on file    Physically abused: Not on file    Forced sexual activity: Not on file  Other Topics Concern  . Not on file  Social History Narrative  . Not on file    Family History  Problem Relation Age of Onset  . Hypertension Mother   . Cancer Father   . Healthy Sister   . Healthy Son   . Early death Son  MVA  . Unexplained death Brother   . Unexplained death Brother   . Early death Sister        died at 102 months   Review of Systems Pertinent negatives listed in HPI Patient Active Problem List   Diagnosis Date Noted  . Essential hypertension 06/04/2018  . Dementia without behavioral disturbance ([place]) 06/04/2018    No Known Allergies  Prior to Admission medications   Medication Sig Start Date End Date Taking? Authorizing Provider  albuterol (PROVENTIL HFA;VENTOLIN HFA) 108 (90 Base) MCG/ACT inhaler Inhale 2 puffs into the lungs every 6 (six) hours as needed for wheezing or shortness of breath. 06/12/18   Scot Jun, FNP  amLODipine (NORVASC) 2.5 MG tablet Take 1 tablet (2.5 mg total) by mouth daily. 06/12/18   Scot Jun, FNP  donepezil (ARICEPT ODT) 5 MG disintegrating tablet Take 1  tablet by mouth at bedtime. 04/18/18   [provider]  furosemide (LASIX) 40 MG tablet Take 0.5 tablets (20 mg total) by mouth every morning. 06/12/18   Scot Jun, FNP  ketorolac (ACULAR) 0.4 % SOLN INSTILL ONE DROP INTO THE RIGHT EYE FOUR TIMES A DAY STARTING ONE DAY AFTER SURGERY 08/17/18   [provider]  ofloxacin (OCUFLOX) 0.3 % ophthalmic solution INSTILL 1 DROP INTO RIGHT EYE 4 TIMES DAILY STARTING 1 DAY BEFORE SURGERY 08/17/18   [provider]  prednisoLONE acetate (PRED FORTE) 1 % ophthalmic suspension INSTILL ONE DROP INTO THE RIGHT EYE FOUR TIMES A DAY AFTER SURGERY 08/17/18   [provider]  simvastatin (ZOCOR) 10 MG tablet Take 1 tablet (10 mg total) by mouth daily at 6 PM. 06/12/18   Scot Jun, FNP  Vitamin D, Ergocalciferol, (DRISDOL) 1.25 MG (50000 UT) CAPS capsule Take 1 capsule (50,000 Units total) by mouth every 7 (seven) days. 06/14/18   Scot Jun, FNP    Past Medical, Surgical Family and Social History reviewed and updated.    Objective:   Today's Vitals   08/30/18 1000  BP: (!) 145/76  Pulse: 95  Resp: 17  SpO2: 96%  Weight: 185 lb (83.9 kg)  Height: 5' (1.524 m)    Wt Readings from Last 3 Encounters:  08/30/18 185 lb (83.9 kg)  07/24/18 181 lb (82.1 kg)  06/12/18 175 lb 12.8 oz (79.7 kg)     Physical Exam General appearance: alert, well developed, well nourished, cooperative and in no distress Head: Normocephalic, without obvious abnormality, atraumatic Respiratory: Respirations even and unlabored, normal respiratory rate Heart: rate and rhythm normal. No gallop or murmurs noted on exam  Abdomen: BS +, no distention, no rebound tenderness, or no mass Extremities: No gross deformities Skin: Skin color, texture, turgor normal. No rashes seen  Psych: Appropriate mood and affect. Neurologic: Mental status: Alert, oriented to person, place, and time, thought content appropriate.  Lab Results   Component Value Date   HGBA1C 6.1 (H) 06/12/2018    Assessment & Plan:  1. Essential hypertension, slightly elevated today Increase amlodipine 5 mg once daily given home readings Explained to family it is important to slowly titrate medication for hypertension given patient 's age to reduce the risk dizziness or fatigue.  2. Screening for blood or protein in urine - POCT URINALYSIS DIP (CLINITEK)  3. Vitamin D deficiency S/P oral vitamin D replacement. Will recheck  Vitamin D, 25-hydroxy  4. CKD (chronic kidney disease) stage 3, GFR 30-59 ml/min (HCC) - Comprehensive metabolic panel  5. Need for pneumococcal vaccine -  Pneumococcal conjugate vaccine 13-valent  6. S/P eye surgery Continue follow-up with Dr. Katy Fitch      -The patient was given clear instructions to go to ER or return to medical center if symptoms do not improve, worsen or new problems develop. The patient verbalized understanding.    Molli Barrows, FNP Primary Care at [facility] [address], [place] [phone]: [phone]

## 2018-08-31 LAB — COMPREHENSIVE METABOLIC PANEL
ALT: 17 IU/L (ref 0–32)
AST: 13 IU/L (ref 0–40)
Albumin/Globulin Ratio: 1.5 (ref 1.2–2.2)
Albumin: 4.4 g/dL (ref 3.6–4.6)
Alkaline Phosphatase: 63 IU/L (ref 39–117)
BUN/Creatinine Ratio: 21 (ref 12–28)
BUN: 39 mg/dL — ABNORMAL HIGH (ref 8–27)
CO2: 25 mmol/L (ref 20–29)
Calcium: 9.9 mg/dL (ref 8.7–10.3)
Chloride: 103 mmol/L (ref 96–106)
Creatinine, Ser: 1.86 mg/dL — ABNORMAL HIGH (ref 0.57–1.00)
GFR calc Af Amer: 29 mL/min/{1.73_m2} — ABNORMAL LOW (ref >59)
GFR calc non Af Amer: 25 mL/min/{1.73_m2} — ABNORMAL LOW (ref >59)
Globulin, Total: 2.9 g/dL (ref 1.5–4.5)
Glucose: 69 mg/dL (ref 65–99)
Potassium: 5.1 mmol/L (ref 3.5–5.2)
Sodium: 141 mmol/L (ref 134–144)
Total Protein: 7.3 g/dL (ref 6.0–8.5)

## 2018-08-31 LAB — VITAMIN D 25 HYDROXY (VIT D DEFICIENCY, FRACTURES): Vit D, 25-Hydroxy: 33.5 ng/mL (ref 30.0–100.0)

## 2018-09-04 ENCOUNTER — Telehealth: Payer: Self-pay | Admitting: Family Medicine

## 2018-09-04 NOTE — Telephone Encounter (Signed)
Patient daughter in law returned call regarding patient lab results. Please f/u

## 2018-09-06 NOTE — Progress Notes (Signed)
Patient's daughter notified of results & recommendations.

## 2018-09-15 ENCOUNTER — Telehealth: Payer: Self-pay | Admitting: Family Medicine

## 2018-09-15 NOTE — Telephone Encounter (Signed)
Patient's daughter called wanting to know if it would be okay to give her mother vitamin C supplements to build her immune system. Please follow up.

## 2018-09-18 NOTE — Telephone Encounter (Signed)
Please advise 

## 2018-09-18 NOTE — Telephone Encounter (Signed)
Vitamin C is ok to take.

## 2018-09-19 NOTE — Telephone Encounter (Signed)
Patient's daughter notified of this information.

## 2018-11-01 DIAGNOSIS — H2512 Age-related nuclear cataract, left eye: Secondary | ICD-10-CM | POA: Diagnosis not present

## 2018-11-02 DIAGNOSIS — F0390 Unspecified dementia without behavioral disturbance: Secondary | ICD-10-CM | POA: Diagnosis not present

## 2018-11-02 DIAGNOSIS — E1129 Type 2 diabetes mellitus with other diabetic kidney complication: Secondary | ICD-10-CM | POA: Diagnosis not present

## 2018-11-02 DIAGNOSIS — I129 Hypertensive chronic kidney disease with stage 1 through stage 4 chronic kidney disease, or unspecified chronic kidney disease: Secondary | ICD-10-CM | POA: Diagnosis not present

## 2018-11-02 DIAGNOSIS — E1122 Type 2 diabetes mellitus with diabetic chronic kidney disease: Secondary | ICD-10-CM | POA: Diagnosis not present

## 2018-11-02 DIAGNOSIS — R82998 Other abnormal findings in urine: Secondary | ICD-10-CM | POA: Diagnosis not present

## 2018-11-02 DIAGNOSIS — E875 Hyperkalemia: Secondary | ICD-10-CM | POA: Diagnosis not present

## 2018-11-02 DIAGNOSIS — N184 Chronic kidney disease, stage 4 (severe): Secondary | ICD-10-CM | POA: Diagnosis not present

## 2018-11-02 DIAGNOSIS — R809 Proteinuria, unspecified: Secondary | ICD-10-CM | POA: Diagnosis not present

## 2018-11-06 DIAGNOSIS — H25012 Cortical age-related cataract, left eye: Secondary | ICD-10-CM | POA: Diagnosis not present

## 2018-11-06 DIAGNOSIS — H25812 Combined forms of age-related cataract, left eye: Secondary | ICD-10-CM | POA: Diagnosis not present

## 2018-11-06 DIAGNOSIS — H5703 Miosis: Secondary | ICD-10-CM | POA: Diagnosis not present

## 2018-11-06 DIAGNOSIS — H2512 Age-related nuclear cataract, left eye: Secondary | ICD-10-CM | POA: Diagnosis not present

## 2018-11-09 DIAGNOSIS — E875 Hyperkalemia: Secondary | ICD-10-CM | POA: Diagnosis not present

## 2018-12-14 DIAGNOSIS — H524 Presbyopia: Secondary | ICD-10-CM | POA: Diagnosis not present

## 2018-12-14 DIAGNOSIS — Z961 Presence of intraocular lens: Secondary | ICD-10-CM | POA: Diagnosis not present

## 2019-01-01 ENCOUNTER — Ambulatory Visit (INDEPENDENT_AMBULATORY_CARE_PROVIDER_SITE_OTHER): Payer: Medicare HMO | Admitting: Family Medicine

## 2019-01-01 ENCOUNTER — Other Ambulatory Visit: Payer: Self-pay

## 2019-01-01 DIAGNOSIS — R7303 Prediabetes: Secondary | ICD-10-CM | POA: Diagnosis not present

## 2019-01-01 DIAGNOSIS — I10 Essential (primary) hypertension: Secondary | ICD-10-CM | POA: Diagnosis not present

## 2019-01-01 DIAGNOSIS — E782 Mixed hyperlipidemia: Secondary | ICD-10-CM

## 2019-01-01 MED ORDER — FUROSEMIDE 20 MG PO TABS: 20.0000 mg | ORAL_TABLET | Freq: Every morning | ORAL | 1 refills | Status: DC

## 2019-01-01 MED ORDER — SIMVASTATIN 10 MG PO TABS: 10.0000 mg | ORAL_TABLET | Freq: Every day | ORAL | 1 refills | Status: DC

## 2019-01-01 MED ORDER — AMLODIPINE BESYLATE 5 MG PO TABS: 5.0000 mg | ORAL_TABLET | Freq: Every day | ORAL | 1 refills | Status: DC

## 2019-01-01 NOTE — Progress Notes (Signed)
Worked up patient for their telephone visit with provider Molli Barrows, FNP-C. Verified date of birth. Speaking with patient & her daughter. Patient's BP has been unable to be monitored at home b/c the machine is broken. Patient denies chest pain, SHOB, lower extremity swelling, dizziness, KWalker, CMA.

## 2019-01-01 NOTE — Progress Notes (Signed)
Virtual Visit via Telephone Note  I connected with Glenda Garcia on 01/01/19 at  9:50 AM EDT by telephone and verified that I am speaking with the correct person using two identifiers.  Location: Patient: Located at home during today's encounter Provider: Located at primary care office     I discussed the limitations, risks, security and privacy concerns of performing an evaluation and management service by telephone and the availability of in person appointments. I also discussed with the patient that there may be a patient responsible charge related to this service. The patient expressed understanding and agreed to proceed.   History of Present Illness: Patient's daughter is providing assistance with obtaining HPI along with patient. Glenda Garcia reports she has been feeling well.  She denies any chest pain, shortness of breath, weakness. Daughter reports patient is receiving assistance of an aide coming in at least 3 times per week who is providing services such as exercise and assisting with ADLs only as needed.  Patient's daughter continues to ensure that she is eating a low-sodium low-fat diet.  During last visit we discussed reducing weight gain and increasing intake of vegetables to improve cholesterol and reduce A1c which is currently the prediabetic range at 6.1.  Daughter reports that Glenda Garcia has been engaging in tai chi and routine walking on a daily basis.  Glenda Garcia also suffers from dementia although is currently not taking Aricept due to concerns of medication causing confusion.  Her mentation has been overall pretty well.  Her daughter reports patient only forgets things occasionally.  Daughter is not checking as follows blood pressures her blood pressure cuff recently broke.  She has had some slight elevations in blood pressure during recent visits here in office.  Daughter reports she will be obtaining the new blood pressure cuff today and I will resume checking on  a daily basis.  Observations/Objective: Patient is engaged in conversation during today's encounter. She is oriented x3.  Patient's responses esponses are appropriate to questions.  Assessment and Plan: 1. Mixed hyperlipidemia Currently on low-dose statin therapy.  Lipid panel will need to be repeated in September.  Reports tolerating statin without myalgias. - Lipid panel; Future  2. Prediabetes Last A1c was 6.1.  Patient has recently begun a exercise and dietary change regimen.  Hopefully this will reduce A1c.  Will recheck in September. - Hemoglobin A1c; Future - Comprehensive metabolic panel; Future  3. Essential hypertension -Continue amlodipine and Lasix. -Daughter will obtain a new blood pressure cuff today and will resume checking blood pressure on a daily basis.  Advised to notify me if blood pressures persistently greater than 140/90 - Comprehensive metabolic panel; Future  Follow Up Instructions: Return to office in September for fasting lipid panel, repeat A1c, and CMP along with blood pressure follow-up.  Patient will also be due for influenza vaccine.   I discussed the assessment and treatment plan with the patient. The patient was provided an opportunity to ask questions and all were answered. The patient agreed with the plan and demonstrated an understanding of the instructions.   The patient was advised to call back or seek an in-person evaluation if the symptoms worsen or if the condition fails to improve as anticipated.  I provided 20 minutes of non-face-to-face time during this encounter.   Molli Barrows, FNP -C

## 2019-01-22 ENCOUNTER — Ambulatory Visit: Payer: Medicare Other | Admitting: Family Medicine

## 2019-03-14 DIAGNOSIS — N184 Chronic kidney disease, stage 4 (severe): Secondary | ICD-10-CM | POA: Diagnosis not present

## 2019-03-14 DIAGNOSIS — E559 Vitamin D deficiency, unspecified: Secondary | ICD-10-CM | POA: Diagnosis not present

## 2019-03-19 DIAGNOSIS — E1129 Type 2 diabetes mellitus with other diabetic kidney complication: Secondary | ICD-10-CM | POA: Diagnosis not present

## 2019-03-19 DIAGNOSIS — N184 Chronic kidney disease, stage 4 (severe): Secondary | ICD-10-CM | POA: Diagnosis not present

## 2019-03-19 DIAGNOSIS — E559 Vitamin D deficiency, unspecified: Secondary | ICD-10-CM | POA: Diagnosis not present

## 2019-03-19 DIAGNOSIS — E1122 Type 2 diabetes mellitus with diabetic chronic kidney disease: Secondary | ICD-10-CM | POA: Diagnosis not present

## 2019-03-19 DIAGNOSIS — R809 Proteinuria, unspecified: Secondary | ICD-10-CM | POA: Diagnosis not present

## 2019-03-19 DIAGNOSIS — I129 Hypertensive chronic kidney disease with stage 1 through stage 4 chronic kidney disease, or unspecified chronic kidney disease: Secondary | ICD-10-CM | POA: Diagnosis not present

## 2019-03-19 DIAGNOSIS — F0390 Unspecified dementia without behavioral disturbance: Secondary | ICD-10-CM | POA: Diagnosis not present

## 2019-03-19 DIAGNOSIS — E875 Hyperkalemia: Secondary | ICD-10-CM | POA: Diagnosis not present

## 2019-03-22 ENCOUNTER — Other Ambulatory Visit: Payer: Self-pay | Admitting: Nephrology

## 2019-03-22 DIAGNOSIS — N184 Chronic kidney disease, stage 4 (severe): Secondary | ICD-10-CM

## 2019-03-23 ENCOUNTER — Telehealth: Payer: Self-pay

## 2019-03-23 NOTE — Telephone Encounter (Signed)
Called patient to do their pre-visit COVID screening.  Call went to voicemail. Unable to do prescreening.  

## 2019-03-26 ENCOUNTER — Ambulatory Visit (INDEPENDENT_AMBULATORY_CARE_PROVIDER_SITE_OTHER): Payer: Medicare HMO | Admitting: Family Medicine

## 2019-03-26 ENCOUNTER — Telehealth: Payer: Self-pay

## 2019-03-26 ENCOUNTER — Other Ambulatory Visit: Payer: Self-pay

## 2019-03-26 VITALS — BP 123/69 | HR 68 | Temp 97.3°F | Resp 17 | Ht 60.0 in | Wt 178.0 lb

## 2019-03-26 DIAGNOSIS — F0390 Unspecified dementia without behavioral disturbance: Secondary | ICD-10-CM | POA: Diagnosis not present

## 2019-03-26 DIAGNOSIS — R7303 Prediabetes: Secondary | ICD-10-CM

## 2019-03-26 DIAGNOSIS — N183 Chronic kidney disease, stage 3 unspecified: Secondary | ICD-10-CM

## 2019-03-26 DIAGNOSIS — E782 Mixed hyperlipidemia: Secondary | ICD-10-CM

## 2019-03-26 DIAGNOSIS — Z79899 Other long term (current) drug therapy: Secondary | ICD-10-CM | POA: Diagnosis not present

## 2019-03-26 DIAGNOSIS — E669 Obesity, unspecified: Secondary | ICD-10-CM | POA: Diagnosis not present

## 2019-03-26 DIAGNOSIS — Z6834 Body mass index (BMI) 34.0-34.9, adult: Secondary | ICD-10-CM

## 2019-03-26 DIAGNOSIS — R82998 Other abnormal findings in urine: Secondary | ICD-10-CM | POA: Diagnosis not present

## 2019-03-26 DIAGNOSIS — I10 Essential (primary) hypertension: Secondary | ICD-10-CM

## 2019-03-26 LAB — POCT URINALYSIS DIP (CLINITEK)
Bilirubin, UA: NEGATIVE
Glucose, UA: NEGATIVE mg/dL
Ketones, POC UA: NEGATIVE mg/dL
Nitrite, UA: NEGATIVE
Spec Grav, UA: 1.02
Urobilinogen, UA: 0.2 U/dL
pH, UA: 6

## 2019-03-26 MED ORDER — DONEPEZIL HCL 5 MG PO TBDP: 5.0000 mg | ORAL_TABLET | Freq: Every day | ORAL | 3 refills | Status: DC

## 2019-03-26 MED ORDER — SIMVASTATIN 10 MG PO TABS: 10.0000 mg | ORAL_TABLET | Freq: Every day | ORAL | 3 refills | Status: AC

## 2019-03-26 MED ORDER — AMLODIPINE BESYLATE 5 MG PO TABS: 5.0000 mg | ORAL_TABLET | Freq: Every day | ORAL | 1 refills | Status: AC

## 2019-03-26 MED ORDER — METOPROLOL SUCCINATE ER 50 MG PO TB24: 50.0000 mg | ORAL_TABLET | Freq: Every day | ORAL | 3 refills | Status: DC

## 2019-03-26 MED ORDER — LISINOPRIL 5 MG PO TABS: 5.0000 mg | ORAL_TABLET | Freq: Every day | ORAL | 3 refills | Status: DC

## 2019-03-26 MED ORDER — FUROSEMIDE 40 MG PO TABS: ORAL_TABLET | ORAL | 99 refills | Status: DC

## 2019-03-26 NOTE — Progress Notes (Signed)
Established Patient Office Visit  Subjective:  Patient ID: Glenda Garcia, female    DOB: 1933-10-19  Age: 85 y.o. MRN: [id number]  CC:  Chief Complaint  Patient presents with  . Prediabetes  . Hypertension  . Hyperlipidemia    HPI Glenda Garcia Glenda Garcia, 85 year old female, presents in follow-up of chronic medical issues including hypertension, hyperlipidemia, prediabetes, chronic kidney disease and dementia without behavioral issues.  Patient is accompanied by her daughter-in-law at today's visit.  Patient's son and daughter-in-law at this patient's primary caregivers.  Daughter-in-law states that patient has been doing well.  Daughter-in-law now prepares low-sodium meals for the patient and patient exercises 3 times per week with age-appropriate exercises such as walking and chair exercises.  Daughter-in-law states that patient was told to limit salt when patient recently saw her kidney doctor in order to help with her blood pressure and reduce lower extremity edema.  Patient reports she feels well at today's visit.        Patient takes all of her medications daily.  She has a good appetite and continues to sleep well.  On review of systems, daughter-in-law has noticed that patient has been urinating more frequently recently.  This Glenda Garcia have been occurring for the past 3 to 4 weeks as daughter-in-law states that whenever they go out somewhere, they will sometimes have to stop on the way to their destination or as soon as they reach their destination the patient has to find a restroom.  Patient denies any headaches or dizziness.  She has had no falls in the past 12 months.  She denies any increased thirst thirst.  She has had no abdominal pain-no nausea/vomiting/diarrhea or constipation.  She denies burning or discomfort with urination.  She denies shortness of breath or cough.  No increased peripheral edema.  Past Medical History:  Diagnosis Date  . CKD (chronic kidney disease), stage III  ([place])   . Glomerulonephritis, chronic   . Hyperlipidemia   . Hypertension   . Hypoparathyroidism ([place])   . Memory loss   . Mild intermittent asthma   . Prediabetes   . Vitamin D deficiency     Past Surgical History:  Procedure Laterality Date  . PARTIAL HYSTERECTOMY    . REPLACEMENT TOTAL KNEE Left     Family History  Problem Relation Age of Onset  . Hypertension Mother   . Cancer Father   . Healthy Sister   . Healthy Son   . Early death Son        MVA  . Unexplained death Brother   . Unexplained death Brother   . Early death Sister        died at 33 months    Social History   Socioeconomic History  . Marital status: Widowed    Spouse name: Not on file  . Number of children: Not on file  . Years of education: Not on file  . Highest education level: Not on file  Occupational History  . Not on file  Social Needs  . Financial resource strain: Not on file  . Food insecurity    Worry: Not on file    Inability: Not on file  . Transportation needs    Medical: Not on file    Non-medical: Not on file  Tobacco Use  . Smoking status: Former Research scientist (life sciences)  . Smokeless tobacco: Former Systems developer    Types: Snuff  Substance and Sexual Activity  . Alcohol use: Not Currently  . Drug use: Never  .  Sexual activity: Not Currently  Lifestyle  . Physical activity    Days per week: Not on file    Minutes per session: Not on file  . Stress: Not on file  Relationships  . Social Herbalist on phone: Not on file    Gets together: Not on file    Attends religious service: Not on file    Active member of club or organization: Not on file    Attends meetings of clubs or organizations: Not on file    Relationship status: Not on file  . Intimate partner violence    Fear of current or ex partner: Not on file    Emotionally abused: Not on file    Physically abused: Not on file    Forced sexual activity: Not on file  Other Topics Concern  . Not on file  Social History Narrative   . Not on file    Outpatient Medications Prior to Visit  Medication Sig Dispense Refill  . amLODipine (NORVASC) 5 MG tablet Take 1 tablet (5 mg total) by mouth daily. 90 tablet 1  . furosemide (LASIX) 40 MG tablet TAKE 1 2 (ONE HALF) TABLET BY MOUTH THREE TIMES A WEEK    . lisinopril (ZESTRIL) 5 MG tablet Take 5 mg by mouth daily.    . metoprolol succinate (TOPROL-XL) 50 MG 24 hr tablet Take 50 mg by mouth daily.    . simvastatin (ZOCOR) 10 MG tablet Take 1 tablet (10 mg total) by mouth daily at 6 PM. 90 tablet 1  . Vitamin D, Cholecalciferol, 10 MCG (400 UNIT) TABS Take 1 tablet by mouth every 3 (three) days.    Marland Kitchen donepezil (ARICEPT ODT) 5 MG disintegrating tablet Take 1 tablet by mouth at bedtime.  5  . albuterol (PROVENTIL HFA;VENTOLIN HFA) 108 (90 Base) MCG/ACT inhaler Inhale 2 puffs into the lungs every 6 (six) hours as needed for wheezing or shortness of breath. (Patient not taking: Reported on 01/01/2019) 1 Inhaler 0  . metoprolol succinate (TOPROL-XL) 50 MG 24 hr tablet Take 50 mg by mouth daily.     No facility-administered medications prior to visit.     No Known Allergies  ROS Review of Systems  Constitutional: Negative for chills, fatigue and fever.  HENT: Negative for sore throat and trouble swallowing.   Eyes: Negative for photophobia and visual disturbance.  Respiratory: Negative for cough and shortness of breath.   Cardiovascular: Negative for chest pain, palpitations and leg swelling.  Gastrointestinal: Negative for abdominal pain, blood in stool, constipation, diarrhea and nausea.  Endocrine: Positive for polyuria. Negative for cold intolerance, heat intolerance, polydipsia and polyphagia.  Genitourinary: Positive for frequency. Negative for difficulty urinating and dysuria.  Musculoskeletal: Negative for arthralgias and back pain.  Neurological: Negative for dizziness and headaches.  Hematological: Negative for adenopathy. Does not bruise/bleed easily.       Objective:    Physical Exam  Constitutional: She is oriented to person, place, and time. She appears well-developed and well-nourished.  Pleasant, overweight/obese elderly female in no acute distress who is accompanied by her daughter-in-law at today's visit.  Patient is wearing a mask as per office COVID-19 precautions  Neck: Normal range of motion. Neck supple. No JVD present. No thyromegaly present.  Cardiovascular: Normal rate and regular rhythm.  No carotid bruit appreciated on exam  Pulmonary/Chest: Effort normal and breath sounds normal.  Abdominal: Soft. There is no abdominal tenderness. There is no rebound and no guarding.  Musculoskeletal: Normal  range of motion.        General: No tenderness or edema.     Comments: No CVA tenderness, no peripheral edema  Lymphadenopathy:    She has no cervical adenopathy.  Neurological: She is alert and oriented to person, place, and time.  Skin: Skin is warm and dry.  Psychiatric: She has a normal mood and affect. Her behavior is normal.  Nursing note and vitals reviewed.   BP 123/69   Pulse 68   Temp (!) 97.3 F (36.3 C) (Temporal)   Resp 17   Ht 5' (1.524 m)   Wt 178 lb (80.7 kg)   SpO2 96%   BMI 34.76 kg/m  Wt Readings from Last 3 Encounters:  03/26/19 178 lb (80.7 kg)  08/30/18 185 lb (83.9 kg)  07/24/18 181 lb (82.1 kg)  Patient with 7 pound weight loss since Glenda 2020   Health Maintenance Due  Topic Date Due  . TETANUS/TDAP  01/21/1955  . DEXA SCAN  01/20/2001   Daughter-in-law reports that patient has already received influenza immunization for this season  Lab Results  Component Value Date   TSH 0.758 06/12/2018   Lab Results  Component Value Date   WBC 9.3 06/12/2018   HGB 14.5 06/12/2018   HCT 44.3 06/12/2018   MCV 90 06/12/2018   PLT 215 06/12/2018   Lab Results  Component Value Date   NA 141 08/30/2018   K 5.1 08/30/2018   CO2 25 08/30/2018   GLUCOSE 69 08/30/2018   BUN 39 (H) 08/30/2018    CREATININE 1.86 (H) 08/30/2018   BILITOT <0.2 08/30/2018   ALKPHOS 63 08/30/2018   AST 13 08/30/2018   ALT 17 08/30/2018   PROT 7.3 08/30/2018   ALBUMIN 4.4 08/30/2018   CALCIUM 9.9 08/30/2018   Lab Results  Component Value Date   CHOL 203 (H) 06/12/2018   Lab Results  Component Value Date   HDL 48 06/12/2018   Lab Results  Component Value Date   LDLCALC 102 (H) 06/12/2018   Lab Results  Component Value Date   TRIG 265 (H) 06/12/2018   Lab Results  Component Value Date   CHOLHDL 4.2 06/12/2018   Lab Results  Component Value Date   HGBA1C 6.1 (H) 06/12/2018      Assessment & Plan:  1. Essential hypertension Good control of blood pressure with value of 123/69 at today's visit.  Continue amlodipine, lisinopril and metoprolol and refills provided at today's visit.  Continue low-sodium diet and regular exercise.  Will check electrolytes/renal function as part of CMP due to long-term use of medications for treatment of hypertension as well as due to hypertension which can affect renal function over time.  She will also have urinalysis to check for protein in the urine and follow-up of hypertension. - amLODipine (NORVASC) 5 MG tablet; Take 1 tablet (5 mg total) by mouth daily.  Dispense: 90 tablet; Refill: 1 - lisinopril (ZESTRIL) 5 MG tablet; Take 1 tablet (5 mg total) by mouth daily.  Dispense: 90 tablet; Refill: 3 - metoprolol succinate (TOPROL-XL) 50 MG 24 hr tablet; Take 1 tablet (50 mg total) by mouth daily.  Dispense: 90 tablet; Refill: 3 - Comprehensive metabolic panel - POCT URINALYSIS DIP (CLINITEK)  2. Mixed hyperlipidemia She is currently on low-dose simvastatin for hyperlipidemia and is eating a healthy diet.  Lipid panel will be done at today's visit as well as comprehensive metabolic panel in follow-up of statin use.  If patient has further decline  in functional status then this medication can likely be stopped as the risk of myopathy might outweigh any benefits  at patient's age and with her health status. - simvastatin (ZOCOR) 10 MG tablet; Take 1 tablet (10 mg total) by mouth daily at 6 PM. To lower cholesterol  Dispense: 90 tablet; Refill: 3 - Comprehensive metabolic panel - Lipid Panel  3. Prediabetes Patient with prediabetes and has not had hemoglobin A1c checked since 06/12/2018.  Daughter-in-law reports that patient is eating a healthy diet and has started a regular exercise program.  She will have repeat hemoglobin A1c and urinalysis done at today's visit.  Daughter-in-law will be notified of the results and if any changes such as addition of medication are needed based on hemoglobin A1c result but will likely avoid use of medication if possible if blood sugars are otherwise reasonably controlled with the use of diet changes and exercise.  -Hemoglobin A1c - POCT URINALYSIS DIP (CLINITEK)  4. CKD (chronic kidney disease) stage 3, GFR 30-59 ml/min (H[facility]) Patient with chronic kidney disease, stage III, which is also followed by nephrology.  Unfortunately, patient's nephrologist is on a different computer system and records from nephrology are not able to be accessed while patient is here at the office.  Patient will have comprehensive metabolic panel at today's visit and she is to continue to control blood pressure and continue the low-sodium diet advised per nephrology.  Refill provided for Lasix to help with peripheral edema and hypertension. - furosemide (LASIX) 40 MG tablet; One pill three times per week  Dispense: 45 tablet; Refill: prn - Comprehensive metabolic panel - POCT URINALYSIS DIP (CLINITEK)  5. Dementia without behavioral disturbance, unspecified dementia type ([place]) Dementia has been stable and patient has had no behavioral disturbances and no abrupt decline and continues to take Aricept daily - donepezil (ARICEPT ODT) 5 MG disintegrating tablet; Take 1 tablet (5 mg total) by mouth at bedtime.  Dispense: 90 tablet; Refill: 3  6.  Encounter for long-term (current) use of medications She will have comprehensive metabolic panel at today's visit in follow-up of long-term use of medications - Comprehensive metabolic panel  7. Leukocytes in urine Patient with abnormal findings of leukocytes in the urine on urinalysis and urine will be sent for culture.  Antibiotic therapy if needed will be based on culture results and daughter-in-law will be notified. - Urine Culture  8.  Obesity Daughter-in-law states that she has changed the patient's diet and patient and daughter-in-law are exercising together.  Patient has lost 7 pounds since her Glenda 2020 visit and blood pressure is within normal at today's visit.  Continue age/ability appropriate exercise and dietary changes.  An After Visit Summary was printed and given to the patient.  Follow-up: Return in about 4 months (around 07/26/2019) for 4 months and as needwed.    Antony Blackbird, MD

## 2019-03-26 NOTE — Telephone Encounter (Signed)
Call placed to patient's sister in law, Ms Wynetta Emery # [phone] to discuss home health services.  Message left with call back requested to this CM # [phone]

## 2019-03-26 NOTE — Telephone Encounter (Signed)
Call received from patient's sister in law Cle'ester. She explained that she and her husband have been caring for patient at home and want to keep her at home.  She works from home and her husband is now back to working outside of the home.    Patient needs assist with ADLs and hs per Cle'ester, patient is very calm and easy going.  They have been paying privately for an aide  3 hours x 3 days/week and are not able to continue to afford that. The patient receives $832/month social security.  Her insurance cost $26.20/month. She currently has large medical bills primarily related to ophthalmology that they are trying to help her pay down.   This CM explained about [facility] day program for individuals with dementia.  the cost is about $70/day but Cle'ester said that they are not able to afford that.  They have not applied for medicaid for her.  Cle'ester was able to access the application on line and will follow up but is not sure that her mother in law will qualify.    The option of [facility] was discussed. Phone # provided, and Cle'ester will call to obtain information.  Patient has medicare and the day program may benefit her. The option of [employer] was also discussed. patient does not have medicaid but still may qualify provided her with the contact #.  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  She can also call [employer] to inquire if they have any coverage for supportive aide services.

## 2019-03-26 NOTE — Progress Notes (Signed)
Patient is fasting.  Patient had flu shot earlier this month.  Sees [facility] regularly. They have made some changes to her BP medications. Daughter states that BP readings have been high at their office.  Patient denies chest pain, SHOB, palpitations, dizziness, headaches, lower extremity swelling.  Patient has completely cut out sodium & is limiting sweets.

## 2019-03-26 NOTE — Patient Instructions (Signed)
Food Basics for Chronic Kidney Disease °When your kidneys are not working well, they cannot remove waste and excess substances from your blood as effectively as they did before. This can lead to a buildup and imbalance of these substances, which can worsen kidney damage and affect how your body functions. Certain foods lead to a buildup of these substances in the body. By changing your diet as recommended by your diet and nutrition specialist (dietitian) or health care provider, you could help prevent further kidney damage and delay or prevent the need for dialysis. °What are tips for following this plan? °General instructions ° °· Work with your health care provider and dietitian to develop a meal plan that is right for you. Foods you can eat, limit, or avoid will be different for each person depending on the stage of kidney disease and any other existing health conditions. °· Talk with your health care provider about whether you should take a vitamin and mineral supplement. °· Use standard measuring cups and spoons to measure servings of foods. Use a kitchen scale to measure portions of protein foods. °· If directed by your health care provider, avoid drinking too much fluid. Measure and count all liquids, including water, ice, soups, flavored gelatin, and frozen desserts such as popsicles or ice cream. °Reading food labels °· Check the amount of sodium in foods. Choose foods that have less than 300 milligrams (mg) per serving. °· Check the ingredient list for phosphorus or potassium-based additives or preservatives. °· Check the amount of saturated and trans fat. Limit or avoid these fats as told by your dietitian. °Shopping °· Avoid buying foods that are: °? Processed, frozen, or prepackaged. °? Calcium-enriched or fortified. °· Do not buy foods that have salt or sodium listed among the first five ingredients. °· Do not buy canned vegetables. °Cooking °· Replace animal proteins, such as meat, fish, eggs, or  dairy, with plant proteins from beans, nuts, and soy. °? Use soy milk instead of cow's milk. °? Add beans or tofu to soups, casseroles, or pasta dishes instead of meat. °· Soak vegetables, such as potatoes, before cooking to reduce potassium. To do this: °? Peel and cut into small pieces. °? Soak in warm water for at least 2 hours. For every 1 cup of vegetables, use 10 cups of water. °? Drain and rinse with warm water. °? Boil for at least 5 minutes. °Meal planning °· Limit the amount of protein from plant and animal sources you eat each day. °· Do not add salt to food when cooking or before eating. °· Eat meals and snacks at around the same time each day. °If you have diabetes: °· If you have diabetes (diabetes mellitus) and chronic kidney disease, it is important to keep your blood glucose in the target range recommended by your health care provider. Follow your diabetes management plan. This may include: °? Checking your blood glucose regularly. °? Taking oral medicines, insulin, or both. °? Exercising for at least 30 minutes on 5 or more days each week, or as told by your health care provider. °? Tracking how many servings of carbohydrates you eat at each meal. °· You may be given specific guidelines on how much of certain foods and nutrients you may eat, depending on your stage of kidney disease and whether you have high blood pressure (hypertension). Follow your meal plan as told by your dietitian. °What nutrients should be limited? °The items listed are not a complete list. Talk with your dietitian   about what dietary choices are best for you. °Potassium °Potassium affects how steadily your heart beats. If too much potassium builds up in your blood, it can cause an irregular heartbeat or even a heart attack. °You may need to eat less potassium, depending on your blood potassium levels and the stage of kidney disease. Talk to your dietitian about how much potassium you may have each day. °You may need to limit  or avoid foods that are high in potassium, such as: °· Milk and soy milk. °· Fruits, such as bananas, papaya, apricots, nectarines, melon, prunes, raisins, kiwi, and oranges. °· Vegetables, such as potatoes, sweet potatoes, yams, tomatoes, leafy greens, beets, okra, avocado, pumpkin, and winter squash. °· White and lima beans. °Phosphorus °Phosphorus is a mineral found in your bones. A balance between calcium and phosphorous is needed to build and maintain healthy bones. Too much phosphorus pulls calcium from your bones. This can make your bones weak and more likely to break. Too much phosphorus can also make your skin itch. °You may need to eat less phosphorus depending on your blood phosphorus levels and the stage of kidney disease. Talk to your dietitian about how much potassium you may have each day. You may need to take medicine to lower your blood phosphorus levels if diet changes do not help. °You may need to limit or avoid foods that are high in phosphorus, such as: °· Milk and dairy products. °· Dried beans and peas. °· Tofu, soy milk, and other soy-based meat replacements. °· Colas. °· Nuts and peanut butter. °· Meat, poultry, and fish. °· Bran cereals and oatmeals. °Protein °Protein helps you to make and keep muscle. It also helps in the repair of your body’s cells and tissues. One of the natural breakdown products of protein is a waste product called urea. When your kidneys are not working properly, they cannot remove wastes, such as urea, like they did before you developed chronic kidney disease. Reducing how much protein you eat can help prevent a buildup of urea in your blood. °Depending on your stage of kidney disease, you may need to limit foods that are high in protein. Sources of animal protein include: °· Meat (all types). °· Fish and seafood. °· Poultry. °· Eggs. °· Dairy. °Other protein foods include: °· Beans and legumes. °· Nuts and nut butter. °· Soy and tofu. °Sodium °Sodium, which is found  in salt, helps maintain a healthy balance of fluids in your body. Too much sodium can increase your blood pressure and have a negative effect on the function of your heart and lungs. Too much sodium can also cause your body to retain too much fluid, making your kidneys work harder. °Most people should have less than 2,300 milligrams (mg) of sodium each day. If you have hypertension, you may need to limit your sodium to 1,500 mg each day. Talk to your dietitian about how much sodium you may have each day. °You may need to limit or avoid foods that are high in sodium, such as: °· Salt seasonings. °· Soy sauce. °· Cured and processed meats. °· Salted crackers and snack foods. °· Fast food. °· Canned soups and most canned foods. °· Pickled foods. °· Vegetable juice. °· Boxed mixes or ready-to-eat boxed meals and side dishes. °· Bottled dressings, sauces, and marinades. °Summary °· Chronic kidney disease can lead to a buildup and imbalance of waste and excess substances in the body. Certain foods lead to a buildup of these substances. By adjusting   your intake of these foods, you could help prevent more kidney damage and delay or prevent the need for dialysis. °· Food adjustments are different for each person with chronic kidney disease. Work with a dietitian to set up nutrient goals and a meal plan that is right for you. °· If you have diabetes and chronic kidney disease, it is important to keep your blood glucose in the target range recommended by your health care provider. °This information is not intended to replace advice given to you by your health care provider. Make sure you discuss any questions you have with your health care provider. °Document Released: 09/04/2002 Document Revised: 10/05/2018 Document Reviewed: 06/09/2016 °Elsevier Patient Education © 2020 [employer] ° °

## 2019-03-27 LAB — COMPREHENSIVE METABOLIC PANEL WITH GFR
ALT: 14 IU/L (ref 0–32)
AST: 21 IU/L (ref 0–40)
Albumin/Globulin Ratio: 1.4 (ref 1.2–2.2)
Albumin: 4.2 g/dL (ref 3.6–4.6)
Alkaline Phosphatase: 72 IU/L (ref 39–117)
BUN/Creatinine Ratio: 14 (ref 12–28)
BUN: 25 mg/dL (ref 8–27)
Bilirubin Total: 0.3 mg/dL (ref 0.0–1.2)
CO2: 18 mmol/L — ABNORMAL LOW (ref 20–29)
Calcium: 9.2 mg/dL (ref 8.7–10.3)
Chloride: 105 mmol/L (ref 96–106)
Creatinine, Ser: 1.8 mg/dL — ABNORMAL HIGH (ref 0.57–1.00)
GFR calc Af Amer: 30 mL/min/1.73 — ABNORMAL LOW
GFR calc non Af Amer: 26 mL/min/1.73 — ABNORMAL LOW
Globulin, Total: 3.1 g/dL (ref 1.5–4.5)
Glucose: 90 mg/dL (ref 65–99)
Potassium: 4.9 mmol/L (ref 3.5–5.2)
Sodium: 142 mmol/L (ref 134–144)
Total Protein: 7.3 g/dL (ref 6.0–8.5)

## 2019-03-27 LAB — URINE CULTURE: Organism ID, Bacteria: NO GROWTH

## 2019-03-27 LAB — HEMOGLOBIN A1C
Est. average glucose Bld gHb Est-mCnc: 126 mg/dL
Hgb A1c MFr Bld: 6 % — ABNORMAL HIGH (ref 4.8–5.6)

## 2019-03-27 LAB — LIPID PANEL
Chol/HDL Ratio: 3.5 ratio (ref 0.0–4.4)
Cholesterol, Total: 138 mg/dL (ref 100–199)
HDL: 40 mg/dL
LDL Chol Calc (NIH): 66 mg/dL (ref 0–99)
Triglycerides: 191 mg/dL — ABNORMAL HIGH (ref 0–149)
VLDL Cholesterol Cal: 32 mg/dL (ref 5–40)

## 2019-03-28 ENCOUNTER — Ambulatory Visit
Admission: RE | Admit: 2019-03-28 | Discharge: 2019-03-28 | Disposition: A | Discharge status: Home / Self Care | Payer: Medicare HMO | Source: Ambulatory Visit | Attending: Nephrology | Admitting: Nephrology

## 2019-03-28 DIAGNOSIS — N1330 Unspecified hydronephrosis: Secondary | ICD-10-CM | POA: Diagnosis not present

## 2019-03-28 DIAGNOSIS — N184 Chronic kidney disease, stage 4 (severe): Secondary | ICD-10-CM

## 2019-03-29 NOTE — Progress Notes (Signed)
Patient's daughtet notified of results & recommendations. Expressed understanding.

## 2019-04-05 DIAGNOSIS — N130 Hydronephrosis with ureteropelvic junction obstruction: Secondary | ICD-10-CM | POA: Diagnosis not present

## 2019-04-05 DIAGNOSIS — N132 Hydronephrosis with renal and ureteral calculous obstruction: Secondary | ICD-10-CM | POA: Diagnosis not present

## 2019-04-05 DIAGNOSIS — N183 Chronic kidney disease, stage 3 unspecified: Secondary | ICD-10-CM | POA: Diagnosis not present

## 2019-04-05 DIAGNOSIS — N201 Calculus of ureter: Secondary | ICD-10-CM | POA: Diagnosis not present

## 2019-04-10 ENCOUNTER — Telehealth (INDEPENDENT_AMBULATORY_CARE_PROVIDER_SITE_OTHER): Payer: Medicare HMO | Admitting: Family Medicine

## 2019-04-10 DIAGNOSIS — N183 Chronic kidney disease, stage 3 unspecified: Secondary | ICD-10-CM

## 2019-04-10 DIAGNOSIS — I10 Essential (primary) hypertension: Secondary | ICD-10-CM | POA: Diagnosis not present

## 2019-04-10 DIAGNOSIS — R05 Cough: Secondary | ICD-10-CM

## 2019-04-10 DIAGNOSIS — T464X5A Adverse effect of angiotensin-converting-enzyme inhibitors, initial encounter: Secondary | ICD-10-CM

## 2019-04-10 MED ORDER — LOSARTAN POTASSIUM 25 MG PO TABS: 25.0000 mg | ORAL_TABLET | Freq: Every day | ORAL | 5 refills | Status: DC

## 2019-04-10 NOTE — Telephone Encounter (Signed)
Patient's daughter left phone message due to complaint of patient with recurrent dry, hacking cough which daughter believes is secondary to use of lisinopril.  Discussed with daughter that lisinopril will be discontinued and new prescription sent to patient's pharmacy for losartan 25 mg once daily.  Patient should have nurse visit for blood pressure check in approximately 2 weeks to make sure that her blood pressure remains controlled but not too low.  If patient has continued cough despite discontinuation of lisinopril, patient will need office visit for further evaluation of cough. New RX sent to patient's pharmacy for losartan 25 mg.

## 2019-04-10 NOTE — Telephone Encounter (Signed)
Patient calling in regards to hacking cough from medication. Please contact patient.

## 2019-04-18 ENCOUNTER — Telehealth: Payer: Self-pay

## 2019-04-18 NOTE — Telephone Encounter (Signed)
Attempted to contact patient's daughter in law, Cle'ester to inquire if she has been able to secure any help with caring for patient. Message left with call back requested to this CM # [phone]

## 2019-04-23 ENCOUNTER — Other Ambulatory Visit: Payer: Self-pay | Admitting: Family Medicine

## 2019-04-23 DIAGNOSIS — Z01818 Encounter for other preprocedural examination: Secondary | ICD-10-CM

## 2019-04-23 NOTE — Progress Notes (Signed)
Patient ID: Glenda Garcia, female   DOB: Apr 07, 1934, 85 y.o.   MRN: [id number]   85 year old female who has been evaluated by [facility] and procedure recommended including cystoscopy, left retrograde pyelogram, left ureteroscopy, laser lithotripsy, stone extraction and stent placement.  Patient needs surgical clearance prior to procedure.  Due to patient's age and other medical conditions, she will be referred to cardiology for preoperative clearance.

## 2019-04-25 ENCOUNTER — Telehealth: Payer: Self-pay | Admitting: *Deleted

## 2019-04-25 NOTE — Telephone Encounter (Signed)
A message was left on both phone's re: new patient appointment.

## 2019-04-27 ENCOUNTER — Telehealth: Payer: Self-pay | Admitting: *Deleted

## 2019-04-27 NOTE — Telephone Encounter (Signed)
-----   Message from Antony Blackbird, MD sent at 04/26/2019  1:38 PM EDT ----- Regarding: contact patient Please contact patient's family and let them know that I have referred patient for an evaluation with cardiology prior to her upcoming procedure with Urology

## 2019-04-27 NOTE — Telephone Encounter (Signed)
Spoke with patient and her daughter in law Johnson,Cle[place] and informed them with what provider stated. Per daughter in law, they already got the Cardiologist appt scheduled.

## 2019-04-27 NOTE — Telephone Encounter (Signed)
LMOM

## 2019-05-03 ENCOUNTER — Encounter: Payer: Self-pay | Admitting: Cardiology

## 2019-05-03 ENCOUNTER — Ambulatory Visit (INDEPENDENT_AMBULATORY_CARE_PROVIDER_SITE_OTHER): Payer: Medicare HMO | Admitting: Cardiology

## 2019-05-03 ENCOUNTER — Other Ambulatory Visit: Payer: Self-pay

## 2019-05-03 VITALS — BP 161/73 | HR 79 | Ht 59.0 in | Wt 176.0 lb

## 2019-05-03 DIAGNOSIS — I10 Essential (primary) hypertension: Secondary | ICD-10-CM | POA: Diagnosis not present

## 2019-05-03 DIAGNOSIS — Z01818 Encounter for other preprocedural examination: Secondary | ICD-10-CM | POA: Diagnosis not present

## 2019-05-03 DIAGNOSIS — E785 Hyperlipidemia, unspecified: Secondary | ICD-10-CM | POA: Diagnosis not present

## 2019-05-03 NOTE — Progress Notes (Signed)
Cardiology Office Note:    Date:  05/03/2019   ID:  Glenda Garcia, DOB [birth year], MRN [id number]  PCP:  Antony Blackbird, MD  Cardiologist:  No primary care provider on file.  Electrophysiologist:  None   Referring MD: Antony Blackbird, MD   Chief Complaint  Patient presents with  . Pre-op Exam    History of Present Illness:    Glenda Garcia is a 85 y.o. female with a hx of CKD stage III, hypertension, hyperlipidemia, prediabetes, dementia referred by Dr. Otho Perl for preoperative evaluation.  Planning cystoscopy, ureteroscopy, laser lithotripsy, stone extraction and stent placement.  Patient is here today with her stepdaughter.  Reports that she has been active.  She does aerobic exercises and will go on walks for up to 20 minutes.  In fact, today they got lost trying to find in the clinic and the patient walked up and down 3 flights of stairs.  She denies any exertional chest pain or dyspnea.  She checks her blood pressure at home and reports that it is well controlled, typically 130s over 70s.    Past Medical History:  Diagnosis Date  . CKD (chronic kidney disease), stage III   . Glomerulonephritis, chronic   . Hyperlipidemia   . Hypertension   . Hypoparathyroidism ([place])   . Memory loss   . Mild intermittent asthma   . Prediabetes   . Vitamin D deficiency     Past Surgical History:  Procedure Laterality Date  . PARTIAL HYSTERECTOMY    . REPLACEMENT TOTAL KNEE Left     Current Medications: Current Meds  Medication Sig  . amLODipine (NORVASC) 5 MG tablet Take 1 tablet (5 mg total) by mouth daily.  . furosemide (LASIX) 40 MG tablet One pill three times per week  . losartan (COZAAR) 25 MG tablet Take 1 tablet (25 mg total) by mouth daily.  . metoprolol succinate (TOPROL-XL) 50 MG 24 hr tablet Take 1 tablet (50 mg total) by mouth daily.  . simvastatin (ZOCOR) 10 MG tablet Take 1 tablet (10 mg total) by mouth daily at 6 PM. To lower cholesterol  . Vitamin D,  Cholecalciferol, 10 MCG (400 UNIT) TABS Take 1 tablet by mouth every 3 (three) days.  . [DISCONTINUED] donepezil (ARICEPT ODT) 5 MG disintegrating tablet Take 1 tablet (5 mg total) by mouth at bedtime.     Allergies:   Patient has no known allergies.   Social History   Socioeconomic History  . Marital status: Widowed    Spouse name: Not on file  . Number of children: Not on file  . Years of education: Not on file  . Highest education level: Not on file  Occupational History  . Not on file  Social Needs  . Financial resource strain: Not on file  . Food insecurity    Worry: Not on file    Inability: Not on file  . Transportation needs    Medical: Not on file    Non-medical: Not on file  Tobacco Use  . Smoking status: Former Research scientist (life sciences)  . Smokeless tobacco: Former Systems developer    Types: Snuff  Substance and Sexual Activity  . Alcohol use: Not Currently  . Drug use: Never  . Sexual activity: Not Currently  Lifestyle  . Physical activity    Days per week: Not on file    Minutes per session: Not on file  . Stress: Not on file  Relationships  . Social Herbalist on phone: Not  on file    Gets together: Not on file    Attends religious service: Not on file    Active member of club or organization: Not on file    Attends meetings of clubs or organizations: Not on file    Relationship status: Not on file  Other Topics Concern  . Not on file  Social History Narrative  . Not on file     Family History: The patient's family history includes Cancer in her father; Early death in her sister and son; Healthy in her sister and son; Hypertension in her mother; Unexplained death in her brother and brother.  ROS:   Please see the history of present illness.    All other systems reviewed and are negative.  EKGs/Labs/Other Studies Reviewed:    The following studies were reviewed today:   EKG:  EKG is ordered today.  The ekg ordered today demonstrates NSR, rate 79, No ST/T  abnormalities  Recent Labs: 06/12/2018: Hemoglobin 14.5; Platelets 215; TSH 0.758 03/26/2019: ALT 14; BUN 25; Creatinine, Ser 1.80; Potassium 4.9; Sodium 142  Recent Lipid Panel    Component Value Date/Time   CHOL 138 03/26/2019 0941   CHOL 117 12/28/2017   TRIG 191 (H) 03/26/2019 0941   TRIG 149 12/28/2017   HDL 40 03/26/2019 0941   HDL 39 12/28/2017   CHOLHDL 3.5 03/26/2019 0941   CHOLHDL 3.0 12/28/2017   LDLCALC 66 03/26/2019 0941   LDLCALC 55 12/28/2017    Physical Exam:    VS:  BP (!) 161/73   Pulse 79   Ht 4\' 11"  (1.499 m)   Wt 176 lb (79.8 kg)   SpO2 96%   BMI 35.55 kg/m     Wt Readings from Last 3 Encounters:  05/03/19 176 lb (79.8 kg)  03/26/19 178 lb (80.7 kg)  08/30/18 185 lb (83.9 kg)     GEN: Well nourished, well developed in no acute distress HEENT: Normal NECK: No JVD LYMPHATICS: No lymphadenopathy CARDIAC: RRR, no murmurs, rubs, gallops RESPIRATORY:  Clear to auscultation without rales, wheezing or rhonchi  ABDOMEN: Soft, non-tender, non-distended MUSCULOSKELETAL:  No edema; No deformity  SKIN: Warm and dry NEUROLOGIC:  Alert and oriented x 3 PSYCHIATRIC:  Normal affect   ASSESSMENT:    1. Pre-op evaluation   2. Essential hypertension   3. Hyperlipidemia, unspecified hyperlipidemia type    PLAN:    In order of problems listed above:  Preoperative evaluation:Planning cystoscopy, ureteroscopy, laser lithotripsy, stone extraction and stent placement.  No symptoms to suggest active cardiac condition.  Good functional capacity, greater than 4 METS.  RCRI score is 0.  Overall would classify as low risk for major adverse cardiac event and no further cardiac testing recommended prior to procedure.  Hypertension: On amlodipine 5 mg daily, losartan 25 mg daily, Toprol-XL 50 mg daily.  Blood pressure elevated in clinic today, though the patient's stepdaughter reports that has been well controlled at home (130s over 24s)  Hyperlipidemia: LDL 66.  On  simvastatin 10 mg daily  RTC in 1 year  Medication Adjustments/Labs and Tests Ordered: Current medicines are reviewed at length with the patient today.  Concerns regarding medicines are outlined above.  No orders of the defined types were placed in this encounter.  No orders of the defined types were placed in this encounter.   Patient Instructions  Medication Instructions:  Continue same medications *If you need a refill on your cardiac medications before your next appointment, please call your pharmacy*  Lab Work: None ordered   Testing/Procedures: None ordered  Follow-Up: At [facility], you and your health needs are our priority.  As part of our continuing mission to provide you with exceptional heart care, we have created designated Provider Care Teams.  These Care Teams include your primary Cardiologist (physician) and Advanced Practice Providers (APPs -  Physician Assistants and Nurse Practitioners) who all work together to provide you with the care you need, when you need it.  Your next appointment:  1 year    Call in August to schedule a November appointment    The format for your next appointment:  Office   Provider:  Dr.Zona Pedro        Signed, Donato Heinz, MD  05/03/2019 9:09 PM    [place]

## 2019-05-03 NOTE — Patient Instructions (Signed)
Medication Instructions:  Continue same medications *If you need a refill on your cardiac medications before your next appointment, please call your pharmacy*  Lab Work: None ordered   Testing/Procedures: None ordered  Follow-Up: At [facility], you and your health needs are our priority.  As part of our continuing mission to provide you with exceptional heart care, we have created designated Provider Care Teams.  These Care Teams include your primary Cardiologist (physician) and Advanced Practice Providers (APPs -  Physician Assistants and Nurse Practitioners) who all work together to provide you with the care you need, when you need it.  Your next appointment:  1 year    Call in August to schedule a November appointment    The format for your next appointment:  Office   Provider:  Dr.Schumann

## 2019-05-08 ENCOUNTER — Other Ambulatory Visit: Payer: Self-pay | Admitting: Urology

## 2019-05-08 ENCOUNTER — Telehealth: Payer: Self-pay

## 2019-05-08 NOTE — Telephone Encounter (Signed)
[facility] wanting a letter stating that patient is cleared for surgery. Patient did see Cardiology on 05/03/2019 & was cleared. [facility] is requiring a letter from patient's PCP though

## 2019-05-11 ENCOUNTER — Encounter (HOSPITAL_COMMUNITY): Payer: Self-pay

## 2019-05-11 NOTE — Patient Instructions (Addendum)
DUE TO COVID-19 ONLY ONE VISITOR IS ALLOWED TO COME WITH YOU AND STAY IN THE WAITING ROOM ONLY DURING PRE OP AND PROCEDURE DAY OF SURGERY. THE 1 VISITOR MAY VISIT WITH YOU AFTER SURGERY IN YOUR PRIVATE ROOM DURING VISITING HOURS ONLY!  YOU NEED TO HAVE A COVID 19 TEST ON_______ @_______ , THIS TEST MUST BE DONE BEFORE SURGERY, COME  [address], [place] , [address].  ([place]) ONCE YOUR COVID TEST IS COMPLETED, PLEASE BEGIN THE QUARANTINE INSTRUCTIONS AS OUTLINED IN YOUR HANDOUT.                Jorden B Cullin  05/11/2019   Your procedure is scheduled on: 05-17-19  Report to [facility] Main  Entrance   Report to admitting at      1030 AM     Call this number if you have problems the morning of surgery [phone]    Remember: Do not eat food or drink liquids :After Midnight.    BRUSH YOUR TEETH MORNING OF SURGERY AND RINSE YOUR MOUTH OUT, NO CHEWING GUM CANDY OR MINTS.     Take these medicines the morning of surgery with A SIP OF WATER: metoprolol, amlodipine                                 You may not have any metal on your body including hair pins and              piercings  Do not wear jewelry, make-up, lotions, powders or perfumes, deodorant             Do not wear nail polish on your fingernails.  Do not shave  48 hours prior to surgery.                 Do not bring valuables to the hospital. [place].  Contacts, dentures or bridgework may not be worn into surgery.      Patients discharged the day of surgery will not be allowed to drive home. IF YOU ARE HAVING SURGERY AND GOING HOME THE SAME DAY, YOU MUST HAVE AN ADULT TO DRIVE YOU HOME AND BE WITH YOU FOR 24 HOURS. YOU MAY GO HOME BY TAXI OR UBER OR ORTHERWISE, BUT AN ADULT MUST ACCOMPANY YOU HOME AND STAY WITH YOU FOR 24 HOURS.  Name and phone number of your driver:  Special Instructions: N/A              Please read over the  following fact sheets you were given: _____________________________________________________________________             [facility] - Preparing for Surgery Before surgery, you can play an important role.  Because skin is not sterile, your skin needs to be as free of germs as possible.  You can reduce the number of germs on your skin by washing with CHG (chlorahexidine gluconate) soap before surgery.  CHG is an antiseptic cleaner which kills germs and bonds with the skin to continue killing germs even after washing. Please DO NOT use if you have an allergy to CHG or antibacterial soaps.  If your skin becomes reddened/irritated stop using the CHG and inform your nurse when you arrive at Short Stay. Do not shave (including legs and underarms) for at least 48 hours  prior to the first CHG shower.  You may shave your face/neck. Please follow these instructions carefully:  1.  Shower with CHG Soap the night before surgery and the  morning of Surgery.  2.  If you choose to wash your hair, wash your hair first as usual with your  normal  shampoo.  3.  After you shampoo, rinse your hair and body thoroughly to remove the  shampoo.                           4.  Use CHG as you would any other liquid soap.  You can apply chg directly  to the skin and wash                       Gently with a scrungie or clean washcloth.  5.  Apply the CHG Soap to your body ONLY FROM THE NECK DOWN.   Do not use on face/ open                           Wound or open sores. Avoid contact with eyes, ears mouth and genitals (private parts).                       Wash face,  Genitals (private parts) with your normal soap.             6.  Wash thoroughly, paying special attention to the area where your surgery  will be performed.  7.  Thoroughly rinse your body with warm water from the neck down.  8.  DO NOT shower/wash with your normal soap after using and rinsing off  the CHG Soap.                9.  Pat yourself dry with a clean  towel.            10.  Wear clean pajamas.            11.  Place clean sheets on your bed the night of your first shower and do not  sleep with pets. Day of Surgery : Do not apply any lotions/deodorants the morning of surgery.  Please wear clean clothes to the hospital/surgery center.  FAILURE TO FOLLOW THESE INSTRUCTIONS MAY RESULT IN THE CANCELLATION OF YOUR SURGERY PATIENT SIGNATURE_________________________________  NURSE SIGNATURE__________________________________  ________________________________________________________________________

## 2019-05-14 ENCOUNTER — Encounter (HOSPITAL_COMMUNITY)
Admission: RE | Admit: 2019-05-14 | Discharge: 2019-05-14 | Disposition: A | Discharge status: Home / Self Care | Payer: Medicare HMO | Source: Ambulatory Visit | Attending: Urology | Admitting: Urology

## 2019-05-14 ENCOUNTER — Other Ambulatory Visit (HOSPITAL_COMMUNITY)
Admission: RE | Admit: 2019-05-14 | Discharge: 2019-05-14 | Disposition: A | Discharge status: Home / Self Care | Payer: Medicare HMO | Source: Ambulatory Visit | Attending: Urology | Admitting: Urology

## 2019-05-14 ENCOUNTER — Other Ambulatory Visit: Payer: Self-pay

## 2019-05-14 ENCOUNTER — Encounter (HOSPITAL_COMMUNITY): Payer: Self-pay

## 2019-05-14 DIAGNOSIS — Z01812 Encounter for preprocedural laboratory examination: Secondary | ICD-10-CM | POA: Insufficient documentation

## 2019-05-14 DIAGNOSIS — Z20828 Contact with and (suspected) exposure to other viral communicable diseases: Secondary | ICD-10-CM | POA: Insufficient documentation

## 2019-05-14 HISTORY — DX: Unspecified osteoarthritis, unspecified site: M19.90

## 2019-05-14 HISTORY — DX: Unspecified dementia, unspecified severity, without behavioral disturbance, psychotic disturbance, mood disturbance, and anxiety: F03.90

## 2019-05-14 HISTORY — DX: Prediabetes: R73.03

## 2019-05-14 LAB — BASIC METABOLIC PANEL
Anion gap: 8 (ref 5–15)
BUN: 27 mg/dL — ABNORMAL HIGH (ref 8–23)
CO2: 22 mmol/L (ref 22–32)
Calcium: 9.1 mg/dL (ref 8.9–10.3)
Chloride: 107 mmol/L (ref 98–111)
Creatinine, Ser: 1.61 mg/dL — ABNORMAL HIGH (ref 0.44–1.00)
GFR calc Af Amer: 34 mL/min — ABNORMAL LOW (ref >60)
GFR calc non Af Amer: 29 mL/min — ABNORMAL LOW (ref >60)
Glucose, Bld: 81 mg/dL (ref 70–99)
Potassium: 4.7 mmol/L (ref 3.5–5.1)
Sodium: 137 mmol/L (ref 135–145)

## 2019-05-14 LAB — CBC
HCT: 46.1 % — ABNORMAL HIGH (ref 36.0–46.0)
Hemoglobin: 14 g/dL (ref 12.0–15.0)
MCH: 29 pg (ref 26.0–34.0)
MCHC: 30.4 g/dL (ref 30.0–36.0)
MCV: 95.4 fL (ref 80.0–100.0)
Platelets: 223 10*3/uL (ref 150–400)
RBC: 4.83 MIL/uL (ref 3.87–5.11)
RDW: 13.8 % (ref 11.5–15.5)
WBC: 9.6 10*3/uL (ref 4.0–10.5)
nRBC: 0 % (ref 0.0–0.2)

## 2019-05-14 NOTE — Progress Notes (Signed)
PCP - Cammie Fulp  Cardiologist - Oswaldo Milian Clearance note 05-03-19 in epic  Chest x-ray -  EKG - 05-03-19 epic Stress Test -  ECHO -  Cardiac Cath -  HGBa1c 03-26-19 in epic pt. preDM  Sleep Study -  CPAP -   Fasting Blood Sugar -  Checks Blood Sugar _____ times a day  Blood Thinner Instructions: Aspirin Instructions: Last Dose:  Anesthesia review: creatine 1.61   Patient denies shortness of breath, fever, cough and chest pain at PAT appointment  NONE. Pt.  Does have some dementia signs own consent but Daughter in law will be with DOS   Patient verbalized understanding of instructions that were given to them at the PAT appointment. Patient was also instructed that they will need to review over the PAT instructions again at home before surgery. note

## 2019-05-15 NOTE — Anesthesia Preprocedure Evaluation (Addendum)
Anesthesia Evaluation  Patient identified by MRN, date of birth, ID band Patient awake    Reviewed: Allergy & Precautions, NPO status , Patient's Chart, lab work & pertinent test results  Airway Mallampati: I  TM Distance: >3 FB Neck ROM: Full    Dental  (+) Edentulous Upper, Edentulous Lower   Pulmonary asthma , former smoker,    breath sounds clear to auscultation       Cardiovascular hypertension, Pt. on medications and Pt. on home beta blockers  Rhythm:Regular Rate:Normal     Neuro/Psych PSYCHIATRIC DISORDERS Dementia    GI/Hepatic negative GI ROS, Neg liver ROS,   Endo/Other  negative endocrine ROS  Renal/GU Renal InsufficiencyRenal disease     Musculoskeletal  (+) Arthritis ,   Abdominal Normal abdominal exam  (+)   Peds  Hematology negative hematology ROS (+)   Anesthesia Other Findings   Reproductive/Obstetrics                           Anesthesia Physical Anesthesia Plan  ASA: III  Anesthesia Plan: General   Post-op Pain Management:    Induction: Intravenous  PONV Risk Score and Plan: 4 or greater and Ondansetron and Treatment may vary due to age or medical condition  Airway Management Planned: LMA  Additional Equipment: None  Intra-op Plan:   Post-operative Plan: Extubation in OR  Informed Consent: I have reviewed the patients History and Physical, chart, labs and discussed the procedure including the risks, benefits and alternatives for the proposed anesthesia with the patient or authorized representative who has indicated his/her understanding and acceptance.       Plan Discussed with: CRNA  Anesthesia Plan Comments: (See PAT note 05/14/2019, Konrad Felix, PA-C)      Anesthesia Quick Evaluation

## 2019-05-15 NOTE — Progress Notes (Signed)
Anesthesia Chart Review   Case: [id number] Date/Time: 05/17/19 1215   Procedures:      CYSTOSCOPY WITH RETROGRADE PYELOGRAM, URETEROSCOPY AND STENT PLACEMENT (Left ) - 1 HR 67 MINS     HOLMIUM LASER APPLICATION (Left )   Anesthesia type: General   Pre-op diagnosis: LEFT URETERAL CALCULUS   Location: [place] / [facility]   Surgeon: Franchot Gallo, MD      DISCUSSION:85 y.o. former smoker with h/o HTN, HLD, pre-diabetes, CKD Stage III, left ureteral calculus scheduled for above procedure 05/17/2019 with Dr. Franchot Gallo.   Pt last seen by cardiologist, Dr. Oswaldo Milian, 05/03/2019.  Per OV note, "Preoperative evaluation:Planning cystoscopy, ureteroscopy, laser lithotripsy, stone extraction and stent placement.  No symptoms to suggest active cardiac condition.  Good functional capacity, greater than 4 METS.  RCRI score is 0.  Overall would classify as low risk for major adverse cardiac event and no further cardiac testing recommended prior to procedure."  Anticipate pt can proceed with planned procedure barring acute status change.   VS: BP (!) 185/76   Pulse 87   Temp 36.7 C (Oral)   Resp 18   Ht 5' (1.524 m)   Wt 79.8 kg   SpO2 97%   BMI 34.37 kg/m   PROVIDERS: Antony Blackbird, MD is PCP last seen 03/26/2019  Oswaldo Milian, MD is Cardiologist  LABS: Labs reviewed: Acceptable for surgery. (all labs ordered are listed, but only abnormal results are displayed)  Labs Reviewed  CBC - Abnormal; Notable for the following components:      Result Value   HCT 46.1 (*)    All other components within normal limits  BASIC METABOLIC PANEL - Abnormal; Notable for the following components:   BUN 27 (*)    Creatinine, Ser 1.61 (*)    GFR calc non Af Amer 29 (*)    GFR calc Af Amer 34 (*)    All other components within normal limits     IMAGES:   EKG: 05/03/2019 Rate 79 bpm Normal sinus rhythm  Possible left atrial enlargement   CV:  Past Medical  History:  Diagnosis Date  . Arthritis   . CKD (chronic kidney disease), stage III   . Dementia ([place])   . Glomerulonephritis, chronic   . Hyperlipidemia   . Hypertension   . Hypoparathyroidism ([place])    pt. denies at preop  . Memory loss   . Mild intermittent asthma   . Pre-diabetes    hgb a1 c 03-26-19 6.0 epic  . Prediabetes   . Vitamin D deficiency     Past Surgical History:  Procedure Laterality Date  . EYE SURGERY     cataract surgery  . PARTIAL HYSTERECTOMY    . REPLACEMENT TOTAL KNEE Left     MEDICATIONS: . amLODipine (NORVASC) 5 MG tablet  . furosemide (LASIX) 40 MG tablet  . losartan (COZAAR) 25 MG tablet  . metoprolol succinate (TOPROL-XL) 50 MG 24 hr tablet  . simvastatin (ZOCOR) 10 MG tablet  . Vitamin D, Cholecalciferol, 25 MCG (1000 UT) CAPS   No current facility-administered medications for this encounter.     Maia Plan [facility] Pre-Surgical Testing [phone] 05/15/19  12:25 PM

## 2019-05-16 ENCOUNTER — Encounter: Payer: Self-pay | Admitting: Family Medicine

## 2019-05-16 LAB — NOVEL CORONAVIRUS, NAA (HOSP ORDER, SEND-OUT TO REF LAB; TAT 18-24 HRS): SARS-CoV-2, NAA: NOT DETECTED

## 2019-05-16 NOTE — Progress Notes (Addendum)
LVM pt. Time change to arrive at 0930 am . Pt. Daughter in law called back and is aware .due to pt. Dementia daughter in law will be needed till pt. Gets settled in short stay and then would like to wait in the car till after surgery over . Pt. Lives with son and daughter in law.Daughter in law was a Marine scientist for 30 years.

## 2019-05-16 NOTE — Telephone Encounter (Signed)
Letter is being faxed today by Franki Cabot, [place]

## 2019-05-16 NOTE — Progress Notes (Signed)
Patient ID: Glenda Garcia, female   DOB: [birth year], 85 y.o.   MRN: [id number]   Note created and printed regarding patient's upcoming clearance for surgery at [facility]

## 2019-05-17 ENCOUNTER — Encounter (HOSPITAL_COMMUNITY): Payer: Self-pay | Admitting: *Deleted

## 2019-05-17 ENCOUNTER — Ambulatory Visit (HOSPITAL_COMMUNITY): Payer: Medicare HMO | Admitting: Physician Assistant

## 2019-05-17 ENCOUNTER — Ambulatory Visit (HOSPITAL_COMMUNITY): Payer: Medicare HMO | Admitting: Certified Registered"

## 2019-05-17 ENCOUNTER — Ambulatory Visit (HOSPITAL_COMMUNITY): Payer: Medicare HMO

## 2019-05-17 ENCOUNTER — Other Ambulatory Visit: Payer: Self-pay

## 2019-05-17 ENCOUNTER — Ambulatory Visit (HOSPITAL_COMMUNITY)
Admission: RE | Admit: 2019-05-17 | Discharge: 2019-05-17 | Disposition: A | Discharge status: Home / Self Care | Payer: Medicare HMO | Attending: Urology | Admitting: Urology

## 2019-05-17 ENCOUNTER — Encounter (HOSPITAL_COMMUNITY)
Admission: RE | Disposition: A | Discharge status: Home / Self Care | Payer: Self-pay | Source: Home / Self Care | Attending: Urology

## 2019-05-17 DIAGNOSIS — M1990 Unspecified osteoarthritis, unspecified site: Secondary | ICD-10-CM | POA: Diagnosis not present

## 2019-05-17 DIAGNOSIS — N132 Hydronephrosis with renal and ureteral calculous obstruction: Secondary | ICD-10-CM | POA: Insufficient documentation

## 2019-05-17 DIAGNOSIS — I129 Hypertensive chronic kidney disease with stage 1 through stage 4 chronic kidney disease, or unspecified chronic kidney disease: Secondary | ICD-10-CM | POA: Insufficient documentation

## 2019-05-17 DIAGNOSIS — E559 Vitamin D deficiency, unspecified: Secondary | ICD-10-CM | POA: Diagnosis not present

## 2019-05-17 DIAGNOSIS — F0390 Unspecified dementia without behavioral disturbance: Secondary | ICD-10-CM | POA: Diagnosis not present

## 2019-05-17 DIAGNOSIS — Z96652 Presence of left artificial knee joint: Secondary | ICD-10-CM | POA: Insufficient documentation

## 2019-05-17 DIAGNOSIS — E785 Hyperlipidemia, unspecified: Secondary | ICD-10-CM | POA: Diagnosis not present

## 2019-05-17 DIAGNOSIS — N183 Chronic kidney disease, stage 3 unspecified: Secondary | ICD-10-CM | POA: Diagnosis not present

## 2019-05-17 DIAGNOSIS — Z8249 Family history of ischemic heart disease and other diseases of the circulatory system: Secondary | ICD-10-CM | POA: Diagnosis not present

## 2019-05-17 DIAGNOSIS — Z79899 Other long term (current) drug therapy: Secondary | ICD-10-CM | POA: Insufficient documentation

## 2019-05-17 DIAGNOSIS — N201 Calculus of ureter: Secondary | ICD-10-CM | POA: Diagnosis not present

## 2019-05-17 DIAGNOSIS — Z87891 Personal history of nicotine dependence: Secondary | ICD-10-CM | POA: Insufficient documentation

## 2019-05-17 DIAGNOSIS — J4520 Mild intermittent asthma, uncomplicated: Secondary | ICD-10-CM | POA: Diagnosis not present

## 2019-05-17 DIAGNOSIS — R7303 Prediabetes: Secondary | ICD-10-CM | POA: Diagnosis not present

## 2019-05-17 HISTORY — PX: HOLMIUM LASER APPLICATION: SHX5852

## 2019-05-17 HISTORY — PX: CYSTOSCOPY WITH RETROGRADE PYELOGRAM, URETEROSCOPY AND STENT PLACEMENT: SHX5789

## 2019-05-17 SURGERY — CYSTOURETEROSCOPY, WITH RETROGRADE PYELOGRAM AND STENT INSERTION
Anesthesia: General | Laterality: Left

## 2019-05-17 MED ORDER — ACETAMINOPHEN 10 MG/ML IV SOLN: 1000.0000 mg | Freq: Once | INTRAVENOUS | Status: DC | PRN

## 2019-05-17 MED ORDER — FENTANYL CITRATE (PF) 100 MCG/2ML IJ SOLN
INTRAMUSCULAR | Status: DC | PRN
  Administered 2019-05-17: 50 ug via INTRAVENOUS
  Administered 2019-05-17 (×2): 25 ug via INTRAVENOUS

## 2019-05-17 MED ORDER — CEPHALEXIN 500 MG PO CAPS: 500.0000 mg | ORAL_CAPSULE | Freq: Two times a day (BID) | ORAL | 0 refills | Status: DC

## 2019-05-17 MED ORDER — ACETAMINOPHEN 325 MG PO TABS: 325.0000 mg | ORAL_TABLET | Freq: Once | ORAL | Status: DC | PRN

## 2019-05-17 MED ORDER — ONDANSETRON HCL 4 MG/2ML IJ SOLN
INTRAMUSCULAR | Status: DC | PRN
  Administered 2019-05-17: 4 mg via INTRAVENOUS

## 2019-05-17 MED ORDER — PHENYLEPHRINE 40 MCG/ML (10ML) SYRINGE FOR IV PUSH (FOR BLOOD PRESSURE SUPPORT)
PREFILLED_SYRINGE | INTRAVENOUS | Status: AC
  Filled 2019-05-17: qty 10

## 2019-05-17 MED ORDER — 0.9 % SODIUM CHLORIDE (POUR BTL) OPTIME
TOPICAL | Status: DC | PRN
  Administered 2019-05-17: 1000 mL

## 2019-05-17 MED ORDER — LIDOCAINE 2% (20 MG/ML) 5 ML SYRINGE
INTRAMUSCULAR | Status: DC | PRN
  Administered 2019-05-17: 40 mg via INTRAVENOUS

## 2019-05-17 MED ORDER — SODIUM CHLORIDE 0.9 % IR SOLN
Status: DC | PRN
  Administered 2019-05-17: 3000 mL

## 2019-05-17 MED ORDER — DEXAMETHASONE SODIUM PHOSPHATE 10 MG/ML IJ SOLN
INTRAMUSCULAR | Status: DC | PRN
  Administered 2019-05-17: 10 mg via INTRAVENOUS

## 2019-05-17 MED ORDER — SUCCINYLCHOLINE CHLORIDE 200 MG/10ML IV SOSY
PREFILLED_SYRINGE | INTRAVENOUS | Status: AC
  Filled 2019-05-17: qty 10

## 2019-05-17 MED ORDER — FENTANYL CITRATE (PF) 100 MCG/2ML IJ SOLN: 25.0000 ug | INTRAMUSCULAR | Status: DC | PRN

## 2019-05-17 MED ORDER — EPHEDRINE 5 MG/ML INJ
INTRAVENOUS | Status: AC
  Filled 2019-05-17: qty 10

## 2019-05-17 MED ORDER — DEXAMETHASONE SODIUM PHOSPHATE 10 MG/ML IJ SOLN
INTRAMUSCULAR | Status: AC
  Filled 2019-05-17: qty 1

## 2019-05-17 MED ORDER — ONDANSETRON HCL 4 MG/2ML IJ SOLN
INTRAMUSCULAR | Status: AC
  Filled 2019-05-17: qty 2

## 2019-05-17 MED ORDER — PROPOFOL 10 MG/ML IV BOLUS
INTRAVENOUS | Status: AC
  Filled 2019-05-17: qty 40

## 2019-05-17 MED ORDER — PHENYLEPHRINE HCL (PRESSORS) 10 MG/ML IV SOLN
INTRAVENOUS | Status: AC
  Filled 2019-05-17: qty 1

## 2019-05-17 MED ORDER — PROPOFOL 10 MG/ML IV BOLUS
INTRAVENOUS | Status: DC | PRN
  Administered 2019-05-17: 100 mg via INTRAVENOUS

## 2019-05-17 MED ORDER — CEFAZOLIN SODIUM-DEXTROSE 2-4 GM/100ML-% IV SOLN
2.0000 g | INTRAVENOUS | Status: AC
  Administered 2019-05-17: 13:00:00 2 g via INTRAVENOUS
  Filled 2019-05-17: qty 100

## 2019-05-17 MED ORDER — LIDOCAINE 2% (20 MG/ML) 5 ML SYRINGE
INTRAMUSCULAR | Status: AC
  Filled 2019-05-17: qty 5

## 2019-05-17 MED ORDER — LACTATED RINGERS IV SOLN
INTRAVENOUS | Status: DC
  Administered 2019-05-17 (×2): via INTRAVENOUS

## 2019-05-17 MED ORDER — LACTATED RINGERS IV SOLN: INTRAVENOUS | Status: DC

## 2019-05-17 MED ORDER — IOHEXOL 300 MG/ML  SOLN
INTRAMUSCULAR | Status: DC | PRN
  Administered 2019-05-17: .6 mL

## 2019-05-17 MED ORDER — PHENYLEPHRINE 40 MCG/ML (10ML) SYRINGE FOR IV PUSH (FOR BLOOD PRESSURE SUPPORT): PREFILLED_SYRINGE | INTRAVENOUS | Status: DC | PRN

## 2019-05-17 MED ORDER — FENTANYL CITRATE (PF) 100 MCG/2ML IJ SOLN
INTRAMUSCULAR | Status: AC
  Filled 2019-05-17: qty 2

## 2019-05-17 MED ORDER — MEPERIDINE HCL 50 MG/ML IJ SOLN: 6.2500 mg | INTRAMUSCULAR | Status: DC | PRN

## 2019-05-17 MED ORDER — ACETAMINOPHEN 160 MG/5ML PO SOLN: 325.0000 mg | Freq: Once | ORAL | Status: DC | PRN

## 2019-05-17 MED ORDER — EPHEDRINE SULFATE-NACL 50-0.9 MG/10ML-% IV SOSY
PREFILLED_SYRINGE | INTRAVENOUS | Status: DC | PRN
  Administered 2019-05-17: 10 mg via INTRAVENOUS

## 2019-05-17 SURGICAL SUPPLY — 24 items
BAG URO CATCHER STRL LF (MISCELLANEOUS) ×3 IMPLANT
BASKET LASER NITINOL 1.9FR (BASKET) IMPLANT
BASKET ZERO TIP NITINOL 2.4FR (BASKET) ×3 IMPLANT
CATH INTERMIT  6FR 70CM (CATHETERS) ×3 IMPLANT
CLOTH BEACON ORANGE TIMEOUT ST (SAFETY) ×3 IMPLANT
COVER SURGICAL LIGHT HANDLE (MISCELLANEOUS) ×3 IMPLANT
COVER WAND RF STERILE (DRAPES) IMPLANT
EXTRACTOR STONE 1.7FRX115CM (UROLOGICAL SUPPLIES) ×6 IMPLANT
FIBER LASER FLEXIVA 365 (UROLOGICAL SUPPLIES) ×3 IMPLANT
FIBER LASER TRAC TIP (UROLOGICAL SUPPLIES) IMPLANT
GLOVE BIOGEL M 8.0 STRL (GLOVE) ×3 IMPLANT
GOWN STRL REUS W/TWL XL LVL3 (GOWN DISPOSABLE) ×3 IMPLANT
GUIDEWIRE ANG ZIPWIRE 038X150 (WIRE) IMPLANT
GUIDEWIRE STR DUAL SENSOR (WIRE) ×3 IMPLANT
IV NS 1000ML (IV SOLUTION) ×2
IV NS 1000ML BAXH (IV SOLUTION) ×1 IMPLANT
KIT TURNOVER KIT A (KITS) IMPLANT
MANIFOLD NEPTUNE II (INSTRUMENTS) ×3 IMPLANT
PACK CYSTO (CUSTOM PROCEDURE TRAY) ×3 IMPLANT
SHEATH URETERAL 12FRX28CM (UROLOGICAL SUPPLIES) ×3 IMPLANT
STENT URET 6FRX24 CONTOUR (STENTS) ×3 IMPLANT
TUBING CONNECTING 10 (TUBING) ×2 IMPLANT
TUBING CONNECTING 10' (TUBING) ×1
TUBING UROLOGY SET (TUBING) ×3 IMPLANT

## 2019-05-17 NOTE — Anesthesia Procedure Notes (Signed)
Procedure Name: LMA Insertion Date/Time: 05/17/2019 12:54 PM Performed by: Silas Sacramento, CRNA Pre-anesthesia Checklist: Patient identified, Emergency Drugs available, Suction available and Patient being monitored Patient Re-evaluated:Patient Re-evaluated prior to induction Oxygen Delivery Method: Circle system utilized Preoxygenation: Pre-oxygenation with 100% oxygen Induction Type: IV induction Ventilation: Mask ventilation without difficulty LMA: LMA inserted LMA Size: 4.0 Number of attempts: 1 Placement Confirmation: positive ETCO2 and breath sounds checked- equal and bilateral Tube secured with: Tape Dental Injury: Teeth and Oropharynx as per pre-operative assessment

## 2019-05-17 NOTE — Interval H&P Note (Signed)
History and Physical Interval Note:  05/17/2019 12:02 PM  Glenda Garcia  has presented today for surgery, with the diagnosis of LEFT URETERAL CALCULUS.  The various methods of treatment have been discussed with the patient and family. After consideration of risks, benefits and other options for treatment, the patient has consented to  Procedure(s) with comments: CYSTOSCOPY WITH RETROGRADE PYELOGRAM, URETEROSCOPY AND STENT PLACEMENT (Left) - 1 HR 45 MINS HOLMIUM LASER APPLICATION (Left) as a surgical intervention.  The patient's history has been reviewed, patient examined, no change in status, stable for surgery.  I have reviewed the patient's chart and labs.  Questions were answered to the patient's satisfaction.     [name]

## 2019-05-17 NOTE — Transfer of Care (Signed)
Immediate Anesthesia Transfer of Care Note  Patient: Glenda Garcia  Procedure(s) Performed: CYSTOSCOPY WITH RETROGRADE PYELOGRAM, URETEROSCOPY AND STENT PLACEMENT (Left ) HOLMIUM LASER APPLICATION (Left )  Patient Location: PACU  Anesthesia Type:General  Level of Consciousness: oriented, drowsy, patient cooperative and responds to stimulation  Airway & Oxygen Therapy: Patient Spontanous Breathing and Patient connected to face mask oxygen  Post-op Assessment: Report given to RN and Post -op Vital signs reviewed and stable  Post vital signs: Reviewed and stable  Last Vitals:  Vitals Value Taken Time  BP 138/63 05/17/19 1406  Temp    Pulse 66 05/17/19 1409  Resp 12 05/17/19 1409  SpO2 100 % 05/17/19 1409  Vitals shown include unvalidated device data.  Last Pain:  Vitals:   05/17/19 1104  TempSrc:   PainSc: 0-No pain      Patients Stated Pain Goal: 2 ([id number])  Complications: No apparent anesthesia complications

## 2019-05-17 NOTE — Anesthesia Postprocedure Evaluation (Signed)
Anesthesia Post Note  Patient: Glenda Garcia  Procedure(s) Performed: CYSTOSCOPY WITH RETROGRADE PYELOGRAM, URETEROSCOPY AND STENT PLACEMENT (Left ) HOLMIUM LASER APPLICATION (Left )     Patient location during evaluation: PACU Anesthesia Type: General Level of consciousness: awake and alert Pain management: pain level controlled Vital Signs Assessment: post-procedure vital signs reviewed and stable Respiratory status: spontaneous breathing, nonlabored ventilation, respiratory function stable and patient connected to nasal cannula oxygen Cardiovascular status: blood pressure returned to baseline and stable Postop Assessment: no apparent nausea or vomiting Anesthetic complications: no    Last Vitals:  Vitals:   05/17/19 1415 05/17/19 1430  BP: (!) 147/62   Pulse: 65 70  Resp: 13 14  Temp:  (!) 36.3 C  SpO2: 100% 94%    Last Pain:  Vitals:   05/17/19 1430  TempSrc:   PainSc: 0-No pain                 Effie Berkshire

## 2019-05-17 NOTE — Interval H&P Note (Signed)
History and Physical Interval Note:  05/17/2019 12:02 PM  Glenda Garcia  has presented today for surgery, with the diagnosis of LEFT URETERAL CALCULUS.  The various methods of treatment have been discussed with the patient and family. After consideration of risks, benefits and other options for treatment, the patient has consented to  Procedure(s) with comments: CYSTOSCOPY WITH RETROGRADE PYELOGRAM, URETEROSCOPY AND STENT PLACEMENT (Left) - 1 HR 45 MINS HOLMIUM LASER APPLICATION (Left) as a surgical intervention.  The patient's history has been reviewed, patient examined, no change in status, stable for surgery.  I have reviewed the patient's chart and labs.  Questions were answered to the patient's satisfaction.     Lillette Boxer Odyssey Vasbinder

## 2019-05-17 NOTE — H&P (Signed)
H&P  Chief Complaint: Left kidney stone  History of Present Illness: Glenda Garcia is a 85 y.o. year old female  Who initially presented to my office about a month ago with left hydronephrosis.  This was of unknown etiology.  CT stone protocol revealed a moderate/ large stone in her left mid ureter with proximal hydroureteronephrosis.  She presents at this time, following cardiac clearance, for cystoscopy, left retrograde, the holmium laser lithotripsy and extraction of her left ureteral stone  Past Medical History:  Diagnosis Date  . Arthritis   . CKD (chronic kidney disease), stage III   . Dementia ([place])   . Glomerulonephritis, chronic   . Hyperlipidemia   . Hypertension   . Hypoparathyroidism ([place])    pt. denies at preop  . Memory loss   . Mild intermittent asthma   . Pre-diabetes    hgb a1 c 03-26-19 6.0 epic  . Prediabetes   . Vitamin D deficiency     Past Surgical History:  Procedure Laterality Date  . EYE SURGERY     cataract surgery  . PARTIAL HYSTERECTOMY    . REPLACEMENT TOTAL KNEE Left     Home Medications:  Medications Prior to Admission  Medication Sig Dispense Refill  . amLODipine (NORVASC) 5 MG tablet Take 1 tablet (5 mg total) by mouth daily. 90 tablet 1  . furosemide (LASIX) 40 MG tablet One pill three times per week (Patient taking differently: Take 20 mg by mouth 3 (three) times a week. ) 45 tablet prn  . losartan (COZAAR) 25 MG tablet Take 1 tablet (25 mg total) by mouth daily. 305 tablet 5  . metoprolol succinate (TOPROL-XL) 50 MG 24 hr tablet Take 1 tablet (50 mg total) by mouth daily. 90 tablet 3  . simvastatin (ZOCOR) 10 MG tablet Take 1 tablet (10 mg total) by mouth daily at 6 PM. To lower cholesterol 90 tablet 3  . Vitamin D, Cholecalciferol, 25 MCG (1000 UT) CAPS Take 1,000 Units by mouth every 3 (three) days.       Allergies: No Known Allergies  Family History  Problem Relation Age of Onset  . Hypertension Mother   . Cancer Father   .  Healthy Sister   . Healthy Son   . Early death Son        MVA  . Unexplained death Brother   . Unexplained death Brother   . Early death Sister        died at 32 months    Social History:  reports that she has quit smoking. Her smoking use included cigarettes. She has never used smokeless tobacco. She reports previous alcohol use. She reports that she does not use drugs.  ROS: A complete review of systems was performed.  All systems are negative except for pertinent findings as noted.  Physical Exam:  Vital signs in last 24 hours: Temp:  [98.4 F (36.9 C)] 98.4 F (36.9 C) (11/19 1031) Pulse Rate:  [74] 74 (11/19 1031) Resp:  [18] 18 (11/19 1031) BP: (159)/(75) 159/75 (11/19 1031) SpO2:  [96 %] 96 % (11/19 1031) Weight:  [79.8 kg] 79.8 kg (11/19 1104) General:  Alert and oriented, No acute distress HEENT: Normocephalic, atraumatic Neck: No JVD or lymphadenopathy Cardiovascular: Regular rate and rhythm Lungs: Clear bilaterally Abdomen: Soft, nontender, nondistended, no abdominal masses Back: No CVA tenderness Extremities: No edema Neurologic: Grossly intact  Laboratory Data:  No results found for this or any previous visit (from the past 24 hour(s)). Recent  Results (from the past 240 hour(s))  Novel Coronavirus, NAA (Hosp order, Send-out to Ref Lab; TAT 18-24 hrs     Status: None   Collection Time: 05/14/19  2:37 PM   Specimen: Nasopharyngeal Swab; Respiratory  Result Value Ref Range Status   SARS-CoV-2, NAA NOT DETECTED NOT DETECTED Final    Comment: (NOTE) This nucleic acid amplification test was developed and its performance characteristics determined by [employer]. Nucleic acid amplification tests include PCR and TMA. This test has not been FDA cleared or approved. This test has been authorized by FDA under an Emergency Use Authorization (EUA). This test is only authorized for the duration of time the declaration that circumstances exist justifying the  authorization of the emergency use of in vitro diagnostic tests for detection of SARS-CoV-2 virus and/or diagnosis of COVID-19 infection under section 564(b)(1) of the Act, 21 U.S.C. [id number]) (1), unless the authorization is terminated or revoked sooner. When diagnostic testing is negative, the possibility of a false negative result should be considered in the context of a patient's recent exposures and the presence of clinical signs and symptoms consistent with COVID-19. An individual without symptoms of COVID- 19 and who is not shedding SARS-CoV-2 vi rus would expect to have a negative (not detected) result in this assay. Performed At: [facility] [address] [place], [place] [id number] Rush Farmer MD [id number]    [place]  Final    Comment: Performed at [facility], [place] [address] [place], [place] [address]   Creatinine: Recent Labs    05/14/19 1359  CREATININE 1.61*    Radiologic Imaging: No results found.  Impression/Assessment:    Large left ureteral stone with associated hydronephrosis  Plan:   cystoscopy, left retrograde ureteral pyelogram, left ureteroscopy, holmium laser lithotripsy and extraction of left ureteral stone, placement of double-J stent   Glenda Garcia 05/17/2019, 11:34 AM  Glenda Boxer. Khilee Hendricksen MD

## 2019-05-18 ENCOUNTER — Encounter (HOSPITAL_COMMUNITY): Payer: Self-pay | Admitting: Urology

## 2019-06-01 DIAGNOSIS — N183 Chronic kidney disease, stage 3 unspecified: Secondary | ICD-10-CM | POA: Diagnosis not present

## 2019-06-04 NOTE — Op Note (Signed)
Diagnosis: Large left mid ureteral stone with hydronephrosis  Postoperative diagnosis: Same  Procedure: Cystoscopy, left retrograde ureteropyelogram, left ureteroscopy, holmium laser trip C and extraction of left ureteral stone, placement of 6 French by 24 cm contour double-J stent without tether  Surgeon: Shadara Lopez  Anesthesia: General  Complications: None  Specimen: Stone fragments  Estimated blood loss: Less than 5 mL  Indications: 85 year old female with recent presentation of hydronephrosis secondary to large left mid/distal ureteral stone.  It was felt that, due to the size of the stone and most likely the length of time stone has been present, lithotripsy alone would not adequately relieve the patient's stone burden.  I discussed ureteroscopy, laser lithotripsy and extraction of her stone with both the patient and her daughter.  Risks and complications including but not limited to infection, bleeding, need for stent, ureteral injury, anesthetic complications and others were discussed.  They understand these and desire to proceed  Findings: Urothelium bladder was normal.  Ureteral orifices were normally placed and of normal configuration.  Retrograde ureteropyelogram on the left revealed a normal distal ureter but a significant filling defect with proximal hydroureteronephrosis upon injection of Omnipaque.  The stone was impacted within the urothelium of the ureter, most likely the longstanding presence of stone.  Scription procedure: The patient was properly identified and marked in the holding area.  She received preoperative IV antibiotics.  Was taken to the operating room where general anesthetic was administered patient placed in the dorsolithotomy position patient taken perineum were prepped and draped.  Proper timeout was performed.  21 French panendoscope advanced to the bladder with the above-mentioned findings noted.  In a retrograde ureteropyelogram was performed with the  open-ended 6 French catheter using Omnipaque.  The above-mentioned findings were noted.  Following this, sensor guidewire was advanced past the stone with some difficulty due to most likely the impaction present.  Once navigated by the stone, it was advanced into the upper pole calyceal system.  The open-ended catheter and cystoscope were removed.  I dilated the ureter up to the point of the stone with the short 12/14 ureteral access catheter, first the obturator and the entire set up.  After dilation, the open-ended catheter was removed and 6 French short semirigid ureteroscope, dual-lumen was passed up to the stone.  It was obvious that the stone had grown into the wall of the ureter, most likely due to the longstanding presence.  The tip of the access catheter was likely placed a small perforation in the ureteral wall.  I was able to fragment the stone into multiple small fragments, however, and these were, with somewhat, ease down into the bladder using the engage back.  I did my best to empty all stone burden.  More than likely, a few small fragments became extra ureteral during this procedure.  I attempted to grasp some of these, but I had a feeling that several fragments were remaining.  I tried not to be over aggressive with this procedure, however.  Upon getting the entire ureter left proximal distal to the stone burden, I failed to see any more fragments.  At this point, the ureteroscope was removed.  Cystoscope was then placed over top of the guidewire, and a 6 [place] by 24 cm contour double-J stent was then placed using fluoroscopic and cystoscopic guidance.  Once deployed with the wire removed, excellent proximal and distal curls were seen.  At this point the scope was removed, the bladder drained, and the procedure terminated.  The  patient tolerated the procedure well.  She was then awakened and taken to the PACU in stable condition.

## 2019-06-07 DIAGNOSIS — N201 Calculus of ureter: Secondary | ICD-10-CM | POA: Diagnosis not present

## 2019-06-14 DIAGNOSIS — N132 Hydronephrosis with renal and ureteral calculous obstruction: Secondary | ICD-10-CM | POA: Diagnosis not present

## 2019-06-14 DIAGNOSIS — N201 Calculus of ureter: Secondary | ICD-10-CM | POA: Diagnosis not present

## 2019-06-15 DIAGNOSIS — N201 Calculus of ureter: Secondary | ICD-10-CM | POA: Diagnosis not present

## 2019-06-15 DIAGNOSIS — N130 Hydronephrosis with ureteropelvic junction obstruction: Secondary | ICD-10-CM | POA: Diagnosis not present

## 2019-08-06 ENCOUNTER — Ambulatory Visit: Payer: Medicare HMO | Admitting: Internal Medicine

## 2019-08-13 ENCOUNTER — Ambulatory Visit: Payer: Medicare HMO | Admitting: Family Medicine

## 2019-08-21 DIAGNOSIS — E785 Hyperlipidemia, unspecified: Secondary | ICD-10-CM | POA: Diagnosis not present

## 2019-08-21 DIAGNOSIS — N1832 Chronic kidney disease, stage 3b: Secondary | ICD-10-CM | POA: Diagnosis not present

## 2019-08-21 DIAGNOSIS — N200 Calculus of kidney: Secondary | ICD-10-CM | POA: Diagnosis not present

## 2019-08-21 DIAGNOSIS — F0390 Unspecified dementia without behavioral disturbance: Secondary | ICD-10-CM | POA: Diagnosis not present

## 2019-08-21 DIAGNOSIS — E782 Mixed hyperlipidemia: Secondary | ICD-10-CM | POA: Diagnosis not present

## 2019-08-21 DIAGNOSIS — Z0000 Encounter for general adult medical examination without abnormal findings: Secondary | ICD-10-CM | POA: Diagnosis not present

## 2019-08-21 DIAGNOSIS — R21 Rash and other nonspecific skin eruption: Secondary | ICD-10-CM | POA: Diagnosis not present

## 2019-08-21 DIAGNOSIS — R7303 Prediabetes: Secondary | ICD-10-CM | POA: Diagnosis not present

## 2019-08-21 DIAGNOSIS — I129 Hypertensive chronic kidney disease with stage 1 through stage 4 chronic kidney disease, or unspecified chronic kidney disease: Secondary | ICD-10-CM | POA: Diagnosis not present

## 2019-08-21 DIAGNOSIS — I10 Essential (primary) hypertension: Secondary | ICD-10-CM | POA: Diagnosis not present

## 2019-08-24 DIAGNOSIS — L309 Dermatitis, unspecified: Secondary | ICD-10-CM | POA: Diagnosis not present

## 2019-08-28 DIAGNOSIS — R21 Rash and other nonspecific skin eruption: Secondary | ICD-10-CM | POA: Diagnosis not present

## 2019-09-10 DIAGNOSIS — N183 Chronic kidney disease, stage 3 unspecified: Secondary | ICD-10-CM | POA: Diagnosis not present

## 2019-09-20 DIAGNOSIS — N200 Calculus of kidney: Secondary | ICD-10-CM | POA: Diagnosis not present

## 2019-09-20 DIAGNOSIS — E875 Hyperkalemia: Secondary | ICD-10-CM | POA: Diagnosis not present

## 2019-09-20 DIAGNOSIS — R809 Proteinuria, unspecified: Secondary | ICD-10-CM | POA: Diagnosis not present

## 2019-09-20 DIAGNOSIS — N1832 Chronic kidney disease, stage 3b: Secondary | ICD-10-CM | POA: Diagnosis not present

## 2019-09-20 DIAGNOSIS — E1129 Type 2 diabetes mellitus with other diabetic kidney complication: Secondary | ICD-10-CM | POA: Diagnosis not present

## 2019-09-20 DIAGNOSIS — E559 Vitamin D deficiency, unspecified: Secondary | ICD-10-CM | POA: Diagnosis not present

## 2019-09-20 DIAGNOSIS — N1330 Unspecified hydronephrosis: Secondary | ICD-10-CM | POA: Diagnosis not present

## 2019-09-20 DIAGNOSIS — E1122 Type 2 diabetes mellitus with diabetic chronic kidney disease: Secondary | ICD-10-CM | POA: Diagnosis not present

## 2019-09-20 DIAGNOSIS — I129 Hypertensive chronic kidney disease with stage 1 through stage 4 chronic kidney disease, or unspecified chronic kidney disease: Secondary | ICD-10-CM | POA: Diagnosis not present

## 2019-09-21 DIAGNOSIS — N200 Calculus of kidney: Secondary | ICD-10-CM | POA: Diagnosis not present

## 2019-12-03 DIAGNOSIS — L309 Dermatitis, unspecified: Secondary | ICD-10-CM | POA: Diagnosis not present

## 2019-12-12 DIAGNOSIS — L280 Lichen simplex chronicus: Secondary | ICD-10-CM | POA: Diagnosis not present

## 2019-12-12 DIAGNOSIS — L981 Factitial dermatitis: Secondary | ICD-10-CM | POA: Diagnosis not present

## 2020-01-09 ENCOUNTER — Ambulatory Visit: Payer: Medicare HMO | Attending: Internal Medicine

## 2020-01-09 DIAGNOSIS — Z20822 Contact with and (suspected) exposure to covid-19: Secondary | ICD-10-CM | POA: Diagnosis not present

## 2020-01-10 LAB — NOVEL CORONAVIRUS, NAA: SARS-CoV-2, NAA: NOT DETECTED

## 2020-01-10 LAB — SARS-COV-2, NAA 2 DAY TAT

## 2020-01-14 DIAGNOSIS — N1832 Chronic kidney disease, stage 3b: Secondary | ICD-10-CM | POA: Diagnosis not present

## 2020-01-22 DIAGNOSIS — E1122 Type 2 diabetes mellitus with diabetic chronic kidney disease: Secondary | ICD-10-CM | POA: Diagnosis not present

## 2020-01-22 DIAGNOSIS — N200 Calculus of kidney: Secondary | ICD-10-CM | POA: Diagnosis not present

## 2020-01-22 DIAGNOSIS — E875 Hyperkalemia: Secondary | ICD-10-CM | POA: Diagnosis not present

## 2020-01-22 DIAGNOSIS — F0390 Unspecified dementia without behavioral disturbance: Secondary | ICD-10-CM | POA: Diagnosis not present

## 2020-01-22 DIAGNOSIS — R809 Proteinuria, unspecified: Secondary | ICD-10-CM | POA: Diagnosis not present

## 2020-01-22 DIAGNOSIS — E1129 Type 2 diabetes mellitus with other diabetic kidney complication: Secondary | ICD-10-CM | POA: Diagnosis not present

## 2020-01-22 DIAGNOSIS — I129 Hypertensive chronic kidney disease with stage 1 through stage 4 chronic kidney disease, or unspecified chronic kidney disease: Secondary | ICD-10-CM | POA: Diagnosis not present

## 2020-01-22 DIAGNOSIS — N1832 Chronic kidney disease, stage 3b: Secondary | ICD-10-CM | POA: Diagnosis not present

## 2020-01-22 DIAGNOSIS — N1330 Unspecified hydronephrosis: Secondary | ICD-10-CM | POA: Diagnosis not present

## 2020-02-25 ENCOUNTER — Telehealth: Payer: Self-pay | Admitting: Cardiology

## 2020-02-25 NOTE — Telephone Encounter (Signed)
lvm for patient to return call to get follow up scheduled with Gardiner Rhyme from recall list

## 2020-05-18 ENCOUNTER — Other Ambulatory Visit: Payer: Self-pay | Admitting: Family Medicine

## 2020-05-18 DIAGNOSIS — I10 Essential (primary) hypertension: Secondary | ICD-10-CM

## 2020-07-17 NOTE — Progress Notes (Signed)
Chief Complaint  Patient presents with  . Follow-up    Rm 1 with daughter Careers information officer) Pt is well,daughter states memory has worsen      HISTORY OF PRESENT ILLNESS: Today 07/21/20  Glenda Garcia is a 87 y.o. female here today for follow up for memory loss. Last seen 06/2018. Aricept made mood worse. Family was advised to consider memantine versus sertraline.  She presents with her daughter who aids in history today.  Over the past 2 years, memory has seemed to progressively decline.  She has seemed to adjust well to changes in living arrangements.  She is now living with her daughter.  She has more difficulty staying on task.  She is not oriented to time.  She does continue to perform activities of daily living independently.  May need some occasional guidance but able to complete task independently.  She exercises daily.  She likes to walk outside with her dog.  Her daughter converted their carport to an exercise room that she uses regularly.  She plays brain games and likes to put together puzzles at home.  She is sleeping well.  No difficulty eating but does have a decreased appetite.  She prefers sweets.  No difficulty with gait.  No falls.  She does not drive.  Her daughter places medications in organizer and she is able to administer independently.  She does have chronic kidney disease.  Primary care has weaned her off of some medications.  No other significant changes to medical history.   HISTORY (copied from previous note)  87 year old female here for evaluation of memory loss.  Patient was living in [facility], independently, when her PCP noted some memory loss problems.  She contacted family lives in [place] that they should, and take care of her.  Mild dementia was suspected.  Patient moved to [facility] in October 2019.  Family has noted additional short-term memory loss problems.  Patient has had significant decline in activities of daily living.  She needs  assistance with shopping, transportation, bills, medications.  She is not able to function independently outside of the home.  Patient tried on donepezil but family noted increasing mood changes and lability, and therefore this medication was stopped.   REVIEW OF SYSTEMS: Out of a complete 14 system review of symptoms, the patient complains only of the following symptoms, memory loss, decreased appetite and all other reviewed systems are negative.   ALLERGIES: No Known Allergies   HOME MEDICATIONS: Outpatient Medications Prior to Visit  Medication Sig Dispense Refill  . amLODipine (NORVASC) 5 MG tablet Take 1 tablet (5 mg total) by mouth daily. 90 tablet 1  . metoprolol succinate (TOPROL-XL) 50 MG 24 hr tablet Take 1 tablet (50 mg total) by mouth daily. 90 tablet 3  . simvastatin (ZOCOR) 10 MG tablet Take 1 tablet (10 mg total) by mouth daily at 6 PM. To lower cholesterol 90 tablet 3  . Vitamin D, Cholecalciferol, 25 MCG (1000 UT) CAPS Take 1,000 Units by mouth every 3 (three) days.     . cephALEXin (KEFLEX) 500 MG capsule Take 1 capsule (500 mg total) by mouth 2 (two) times daily. 10 capsule 0  . furosemide (LASIX) 40 MG tablet One pill three times per week (Patient taking differently: Take 20 mg by mouth 3 (three) times a week. ) 45 tablet prn  . losartan (COZAAR) 25 MG tablet Take 1 tablet (25 mg total) by mouth daily. 305 tablet 5   No facility-administered medications  prior to visit.     PAST MEDICAL HISTORY: Past Medical History:  Diagnosis Date  . Arthritis   . CKD (chronic kidney disease), stage III ([place])   . Dementia ([place])   . Glomerulonephritis, chronic   . Hyperlipidemia   . Hypertension   . Hypoparathyroidism ([place])    pt. denies at preop  . Memory loss   . Mild intermittent asthma   . Pre-diabetes    hgb a1 c 03-26-19 6.0 epic  . Prediabetes   . Vitamin D deficiency      PAST SURGICAL HISTORY: Past Surgical History:  Procedure Laterality Date  .  CYSTOSCOPY WITH RETROGRADE PYELOGRAM, URETEROSCOPY AND STENT PLACEMENT Left 05/17/2019   Procedure: CYSTOSCOPY WITH RETROGRADE PYELOGRAM, URETEROSCOPY AND STENT PLACEMENT;  Surgeon: Franchot Gallo, MD;  Location: [facility];  Service: Urology;  Laterality: Left;  1 HR 45 MINS  . EYE SURGERY     cataract surgery  . HOLMIUM LASER APPLICATION Left [id number]   Procedure: HOLMIUM LASER APPLICATION;  Surgeon: Franchot Gallo, MD;  Location: [facility];  Service: Urology;  Laterality: Left;  . PARTIAL HYSTERECTOMY    . REPLACEMENT TOTAL KNEE Left      FAMILY HISTORY: Family History  Problem Relation Age of Onset  . Hypertension Mother   . Cancer Father   . Healthy Sister   . Healthy Son   . Early death Son        MVA  . Unexplained death Brother   . Unexplained death Brother   . Early death Sister        died at 24 months     SOCIAL HISTORY: Social History   Socioeconomic History  . Marital status: Widowed    Spouse name: Not on file  . Number of children: Not on file  . Years of education: Not on file  . Highest education level: Not on file  Occupational History  . Not on file  Tobacco Use  . Smoking status: Former Smoker    Types: Cigarettes  . Smokeless tobacco: Never Used  . Tobacco comment: dont remember when quit so long ago  Vaping Use  . Vaping Use: Never used  Substance and Sexual Activity  . Alcohol use: Not Currently  . Drug use: Never  . Sexual activity: Not Currently  Other Topics Concern  . Not on file  Social History Narrative  . Not on file   Social Determinants of Health   Financial Resource Strain: Not on file  Food Insecurity: Not on file  Transportation Needs: Not on file  Physical Activity: Not on file  Stress: Not on file  Social Connections: Not on file  Intimate Partner Violence: Not on file      PHYSICAL EXAM  Vitals:   07/21/20 0723  BP: 139/75  Pulse: 81  Weight: 174 lb 3.2 oz (79 kg)  Height: '4\' 10"'$  (1.473 m)   Body mass  index is 36.41 kg/m.   Generalized: Well developed, in no acute distress  Cardiology: normal rate and rhythm, no murmur auscultated  Respiratory: clear to auscultation bilaterally    Neurological examination  Mentation: Alert, she is not oriented to time, or place, but is able to aid in some history taking. Follows all commands speech and language fluent Cranial nerve II-XII: Pupils were equal round reactive to light. Extraocular movements were full, visual field were full on confrontational test. Facial sensation and strength were normal. Head turning and shoulder shrug  were normal and symmetric. Motor: The  motor testing reveals 5 over 5 strength of all 4 extremities. Good symmetric motor tone is noted throughout.  Sensory: Sensory testing is intact to soft touch on all 4 extremities. No evidence of extinction is noted.  Coordination: Cerebellar testing reveals good finger-nose-finger and heel-to-shin bilaterally.  Gait and station: Gait is normal.     DIAGNOSTIC DATA (LABS, IMAGING, TESTING) - I reviewed patient records, labs, notes, testing and imaging myself where available.  Lab Results  Component Value Date   WBC 9.6 05/14/2019   HGB 14.0 05/14/2019   HCT 46.1 (H) 05/14/2019   MCV 95.4 05/14/2019   PLT 223 05/14/2019      Component Value Date/Time   NA 137 05/14/2019 1359   NA 142 03/26/2019 0941   NA 138 05/16/2018 0000   K 4.7 05/14/2019 1359   K 3.8 05/16/2018 0000   CL 107 05/14/2019 1359   CL 101 05/16/2018 0000   CO2 22 05/14/2019 1359   CO2 27 05/16/2018 0000   GLUCOSE 81 05/14/2019 1359   BUN 27 (H) 05/14/2019 1359   BUN 25 03/26/2019 0941   CREATININE 1.61 (H) 05/14/2019 1359   CALCIUM 9.1 05/14/2019 1359   CALCIUM 9.0 05/16/2018 0000   PROT 7.3 03/26/2019 0941   ALBUMIN 4.2 03/26/2019 0941   ALBUMIN 4.0 12/28/2017 0000   AST 21 03/26/2019 0941   AST 16 12/28/2017 0000   ALT 14 03/26/2019 0941   ALT 12 12/28/2017 0000   ALKPHOS 72 03/26/2019 0941    ALKPHOS 73 12/28/2017 0000   BILITOT 0.3 03/26/2019 0941   GFRNONAA 29 (L) 05/14/2019 1359   GFRAA 34 (L) 05/14/2019 1359   GFRAA 27 05/16/2018 0000   Lab Results  Component Value Date   CHOL 138 03/26/2019   HDL 40 03/26/2019   LDLCALC 66 03/26/2019   TRIG 191 (H) 03/26/2019   CHOLHDL 3.5 03/26/2019   Lab Results  Component Value Date   HGBA1C 6.0 (H) 03/26/2019   No results found for: VITAMINB12 Lab Results  Component Value Date   TSH 0.758 06/12/2018    MMSE - Mini Mental State Exam 07/21/2020 07/24/2018  Orientation to time 0 3  Orientation to Place 0 4  Registration 3 3  Attention/ Calculation 2 2  Recall 0 1  Language- name 2 objects 2 2  Language- repeat 1 1  Language- follow 3 step command 3 3  Language- read & follow direction 1 1  Write a sentence 1 1  Copy design 1 1  Total score 14 22     ASSESSMENT AND PLAN  87 y.o. year old female  has a past medical history of Arthritis, CKD (chronic kidney disease), stage III ([place]), Dementia ([place]), Glomerulonephritis, chronic, Hyperlipidemia, Hypertension, Hypoparathyroidism ([place]), Memory loss, Mild intermittent asthma, Pre-diabetes, Prediabetes, and Vitamin D deficiency. here with   Late onset Alzheimer's dementia without behavioral disturbance ([place])  Saylee is doing fairly well today from a physical standpoint.  Memory loss has continued to decline.  Previous MMSE 22 of 30, today it is 14 of 30.  She is not oriented to place or time.  She appears happy.  She is able to complete most ADLs independently.  We have discussed progression of disease process.  She was unable to tolerate Aricept in the past.  We have discussed benefits and risk of Namenda.  She is uncertain if she wishes to start any medication at this time.  I provided additional educational information in her AVS.  I have  advised her to review this with primary care and any other members of her care team or family that she wishes.  I have encouraged her  to continue memory compensation strategies, regular physical exercise, and well-balanced diet.  She may call me if she wishes to start [place].  Otherwise, follow-up as needed.  She and her daughter verbalized understanding and agreement with this plan.  No orders of the defined types were placed in this encounter.     I spent 20 minutes of face-to-face and non-face-to-face time with patient.  This included previsit chart review, lab review, study review, order entry, electronic health record documentation, patient education.    Debbora Presto, MSN, FNP-C 07/21/2020, 8:27 AM  [facility] [address] [place], [place] [address] [phone]

## 2020-07-21 ENCOUNTER — Ambulatory Visit: Payer: Medicare HMO | Admitting: Family Medicine

## 2020-07-21 ENCOUNTER — Other Ambulatory Visit: Payer: Self-pay

## 2020-07-21 ENCOUNTER — Encounter: Payer: Self-pay | Admitting: Family Medicine

## 2020-07-21 VITALS — BP 139/75 | HR 81 | Ht <= 58 in | Wt 174.2 lb

## 2020-07-21 DIAGNOSIS — G301 Alzheimer's disease with late onset: Secondary | ICD-10-CM

## 2020-07-21 DIAGNOSIS — F0280 Dementia in other diseases classified elsewhere without behavioral disturbance: Secondary | ICD-10-CM

## 2020-07-21 NOTE — Patient Instructions (Signed)
Below is our plan:  We will continue to monitor symptoms.   Please make sure you are staying well hydrated. I recommend 50-60 ounces daily. Well balanced diet and regular exercise encouraged.    Please continue follow up with care team as directed.   Follow up as needed  You may receive a survey regarding today's visit. I encourage you to leave honest feed back as I do use this information to improve patient care. Thank you for seeing me today!      Memantine Tablets What is this medicine? MEMANTINE (MEM an teen) is used to treat dementia caused by Alzheimer's disease. This medicine may be used for other purposes; ask your health care provider or pharmacist if you have questions. COMMON BRAND NAME(S): Namenda What should I tell my health care provider before I take this medicine? They need to know if you have any of these conditions:  difficulty passing urine  kidney disease  liver disease  seizures  an unusual or allergic reaction to memantine, other medicines, foods, dyes, or preservatives  pregnant or trying to get pregnant  breast-feeding How should I use this medicine? Take this medicine by mouth with a glass of water. Follow the directions on the prescription label. You may take this medicine with or without food. Take your doses at regular intervals. Do not take your medicine more often than directed. Continue to take your medicine even if you feel better. Do not stop taking except on the advice of your doctor or health care professional. Talk to your pediatrician regarding the use of this medicine in children. Special care may be needed. Overdosage: If you think you have taken too much of this medicine contact a poison control center or emergency room at once. NOTE: This medicine is only for you. Do not share this medicine with others. What if I miss a dose? If you miss a dose, take it as soon as you can. If it is almost time for your next dose, take only that dose.  Do not take double or extra doses. If you do not take your medicine for several days, contact your health care provider. Your dose may need to be changed. What may interact with this medicine?  acetazolamide  amantadine  cimetidine  dextromethorphan  dofetilide  hydrochlorothiazide  ketamine  metformin  methazolamide  quinidine  ranitidine  sodium bicarbonate  triamterene This list may not describe all possible interactions. Give your health care provider a list of all the medicines, herbs, non-prescription drugs, or dietary supplements you use. Also tell them if you smoke, drink alcohol, or use illegal drugs. Some items may interact with your medicine. What should I watch for while using this medicine? Visit your doctor or health care professional for regular checks on your progress. Check with your doctor or health care professional if there is no improvement in your symptoms or if they get worse. You may get drowsy or dizzy. Do not drive, use machinery, or do anything that needs mental alertness until you know how this drug affects you. Do not stand or sit up quickly, especially if you are an older patient. This reduces the risk of dizzy or fainting spells. Alcohol can make you more drowsy and dizzy. Avoid alcoholic drinks. What side effects may I notice from receiving this medicine? Side effects that you should report to your doctor or health care professional as soon as possible:  allergic reactions like skin rash, itching or hives, swelling of the face, lips, or  tongue  agitation or a feeling of restlessness  depressed mood  dizziness  hallucinations  redness, blistering, peeling or loosening of the skin, including inside the mouth  seizures  vomiting Side effects that usually do not require medical attention (report to your doctor or health care professional if they continue or are bothersome):  constipation  diarrhea  headache  nausea  trouble  sleeping This list may not describe all possible side effects. Call your doctor for medical advice about side effects. You may report side effects to FDA at 1-800-FDA-1088. Where should I keep my medicine? Keep out of the reach of children. Store at room temperature between 15 degrees and 30 degrees C (59 degrees and 86 degrees F). Throw away any unused medicine after the expiration date. NOTE: This sheet is a summary. It may not cover all possible information. If you have questions about this medicine, talk to your doctor, pharmacist, or health care provider.  2021 Elsevier/Gold Standard (2013-04-02 14:10:42)   Memory Compensation Strategies  1. Use "WARM" strategy.  W= write it down  A= associate it  R= repeat it  M= make a mental note  2.   You can keep a Social worker.  Use a 3-ring notebook with sections for the following: calendar, important names and phone numbers,  medications, doctors' names/phone numbers, lists/reminders, and a section to journal what you did  each day.   3.    Use a calendar to write appointments down.  4.    Write yourself a schedule for the day.  This can be placed on the calendar or in a separate section of the Memory Notebook.  Keeping a  regular schedule can help memory.  5.    Use medication organizer with sections for each day or morning/evening pills.  You may need help loading it  6.    Keep a basket, or pegboard by the door.  Place items that you need to take out with you in the basket or on the pegboard.  You may also want to  include a message board for reminders.  7.    Use sticky notes.  Place sticky notes with reminders in a place where the task is performed.  For example: " turn off the  stove" placed by the stove, "lock the door" placed on the door at eye level, " take your medications" on  the bathroom mirror or by the place where you normally take your medications.  8.    Use alarms/timers.  Use while cooking to remind yourself to  check on food or as a reminder to take your medicine, or as a  reminder to make a call, or as a reminder to perform another task, etc.

## 2020-08-04 NOTE — Progress Notes (Signed)
I reviewed note and agree with plan.   Penni Bombard, MD [id number], [id number] PM Certified in Neurology, Neurophysiology and Neuroimaging  [facility] [address] [place],  [address] [phone]

## 2020-09-22 NOTE — Progress Notes (Signed)
Cardiology Office Note:    Date:  09/24/2020   ID:  PONDA WINDISH, DOB 1933/11/18, MRN [id number]  PCP:  Rudene Anda, MD  Cardiologist:  No primary care provider on file.  Electrophysiologist:  None   Referring MD: Rudene Anda, MD   Chief Complaint  Patient presents with  . Shortness of Breath    History of Present Illness:    Glenda Garcia is a 87 y.o. female with a hx of CKD stage III, hypertension, hyperlipidemia, prediabetes, dementia who presents for follow-up.  She was referred by Dr. Otho Perl for preoperative evaluation on 04/25/2019, was planning cystoscopy, ureteroscopy, laser lithotripsy, stone extraction and stent placement.  No further cardiac work-up was recommended prior to procedure.  Since last clinic visit, reports has been having shortness of breath with walking short distances.  Son reports that she goes out for walks about 3 times a week for 10 minutes, but has been having to take a break after 5 minutes due to shortness of breath.  Denies any chest pain.  Denies any lightheadedness, syncope, lower extremity edema, or palpitations.   Past Medical History:  Diagnosis Date  . Arthritis   . CKD (chronic kidney disease), stage III ([place])   . Dementia ([place])   . Glomerulonephritis, chronic   . Hyperlipidemia   . Hypertension   . Hypoparathyroidism ([place])    pt. denies at preop  . Memory loss   . Mild intermittent asthma   . Pre-diabetes    hgb a1 c 03-26-19 6.0 epic  . Prediabetes   . Vitamin D deficiency     Past Surgical History:  Procedure Laterality Date  . CYSTOSCOPY WITH RETROGRADE PYELOGRAM, URETEROSCOPY AND STENT PLACEMENT Left 05/17/2019   Procedure: CYSTOSCOPY WITH RETROGRADE PYELOGRAM, URETEROSCOPY AND STENT PLACEMENT;  Surgeon: Franchot Gallo, MD;  Location: [facility];  Service: Urology;  Laterality: Left;  1 HR 45 MINS  . EYE SURGERY     cataract surgery  . HOLMIUM LASER APPLICATION Left [id number]   Procedure: HOLMIUM LASER APPLICATION;   Surgeon: Franchot Gallo, MD;  Location: [facility];  Service: Urology;  Laterality: Left;  . PARTIAL HYSTERECTOMY    . REPLACEMENT TOTAL KNEE Left     Current Medications: Current Meds  Medication Sig  . amLODipine (NORVASC) 5 MG tablet Take 1 tablet (5 mg total) by mouth daily.  Marland Kitchen amoxicillin-clavulanate (AUGMENTIN) 250-125 MG tablet Take 1 tablet by mouth 3 (three) times daily.  . metoprolol succinate (TOPROL-XL) 50 MG 24 hr tablet Take 1 tablet (50 mg total) by mouth daily.  . simvastatin (ZOCOR) 10 MG tablet Take 1 tablet (10 mg total) by mouth daily at 6 PM. To lower cholesterol  . Vitamin D, Cholecalciferol, 25 MCG (1000 UT) CAPS Take 1,000 Units by mouth every 3 (three) days.      Allergies:   Patient has no known allergies.   Social History   Socioeconomic History  . Marital status: Widowed    Spouse name: Not on file  . Number of children: Not on file  . Years of education: Not on file  . Highest education level: Not on file  Occupational History  . Not on file  Tobacco Use  . Smoking status: Former Smoker    Types: Cigarettes  . Smokeless tobacco: Never Used  . Tobacco comment: dont remember when quit so long ago  Vaping Use  . Vaping Use: Never used  Substance and Sexual Activity  . Alcohol use: Not Currently  . Drug  use: Never  . Sexual activity: Not Currently  Other Topics Concern  . Not on file  Social History Narrative  . Not on file   Social Determinants of Health   Financial Resource Strain: Not on file  Food Insecurity: Not on file  Transportation Needs: Not on file  Physical Activity: Not on file  Stress: Not on file  Social Connections: Not on file     Family History: The patient's family history includes Cancer in her father; Early death in her sister and son; Healthy in her sister and son; Hypertension in her mother; Unexplained death in her brother and brother.  ROS:   Please see the history of present illness.    All other systems  reviewed and are negative.  EKGs/Labs/Other Studies Reviewed:    The following studies were reviewed today:   EKG:  EKG is ordered today.  The ekg ordered today demonstrates NSR, rate 77, No ST/T abnormalities  Recent Labs: No results found for requested labs within last 8760 hours.  Recent Lipid Panel    Component Value Date/Time   CHOL 138 03/26/2019 0941   CHOL 117 12/28/2017 0000   TRIG 191 (H) 03/26/2019 0941   TRIG 149 12/28/2017 0000   HDL 40 03/26/2019 0941   HDL 39 12/28/2017 0000   CHOLHDL 3.5 03/26/2019 0941   CHOLHDL 3.0 12/28/2017 0000   LDLCALC 66 03/26/2019 0941   LDLCALC 55 12/28/2017 0000    Physical Exam:    VS:  BP (!) 130/58 (BP Location: Left Arm, Patient Position: Sitting)   Pulse 77   Ht '4\' 10"'$  (1.473 m)   Wt 167 lb 3.2 oz (75.8 kg)   SpO2 96%   BMI 34.94 kg/m     Wt Readings from Last 3 Encounters:  09/24/20 167 lb 3.2 oz (75.8 kg)  07/21/20 174 lb 3.2 oz (79 kg)  05/17/19 175 lb 14.8 oz (79.8 kg)     GEN: Well nourished, well developed in no acute distress HEENT: Normal NECK: No JVD CARDIAC: RRR, no murmurs, rubs, gallops RESPIRATORY:  Clear to auscultation without rales, wheezing or rhonchi  ABDOMEN: Soft, non-tender, non-distended MUSCULOSKELETAL:  No edema; No deformity  SKIN: Warm and dry NEUROLOGIC:  Alert and oriented x 2, did not know the year PSYCHIATRIC:  Normal affect   ASSESSMENT:    1. Shortness of breath   2. Essential hypertension   3. Hyperlipidemia, unspecified hyperlipidemia type    PLAN:    In order of problems listed above:  Dyspnea on exertion: Could be related to deconditioning, will check echocardiogram to rule out structural heart disease  Hypertension: On amlodipine 5 mg daily, Toprol-XL 50 mg daily.  Appears controlled  Hyperlipidemia: Most recent LDL 66.  On simvastatin 10 mg daily  RTC in 1 year  Medication Adjustments/Labs and Tests Ordered: Current medicines are reviewed at length with the  patient today.  Concerns regarding medicines are outlined above.  Orders Placed This Encounter  Procedures  . EKG 12-Lead  . ECHOCARDIOGRAM COMPLETE   No orders of the defined types were placed in this encounter.   Patient Instructions  Medication Instructions:  Your physician recommends that you continue on your current medications as directed. Please refer to the Current Medication list given to you today.  *If you need a refill on your cardiac medications before your next appointment, please call your pharmacy*  Testing/Procedures: Your physician has requested that you have an echocardiogram. Echocardiography is a painless test that uses sound  waves to create images of your heart. It provides your doctor with information about the size and shape of your heart and how well your heart's chambers and valves are working. This procedure takes approximately one hour. There are no restrictions for this procedure.  This will be done at our [facility] location:  [place]: At [facility], you and your health needs are our priority.  As part of our continuing mission to provide you with exceptional heart care, we have created designated Provider Care Teams.  These Care Teams include your primary Cardiologist (physician) and Advanced Practice Providers (APPs -  Physician Assistants and Nurse Practitioners) who all work together to provide you with the care you need, when you need it.  We recommend signing up for the patient portal called "MyChart".  Sign up information is provided on this After Visit Summary.  MyChart is used to connect with patients for Virtual Visits (Telemedicine).  Patients are able to view lab/test results, encounter notes, upcoming appointments, etc.  Non-urgent messages can be sent to your provider as well.   To learn more about what you can do with MyChart, go to [email or web address].    Your next appointment:   12 month(s)  The  format for your next appointment:   In Person  Provider:   Oswaldo Milian, MD       Signed, Donato Heinz, MD  09/24/2020 8:38 AM    [place]

## 2020-09-24 ENCOUNTER — Other Ambulatory Visit: Payer: Self-pay

## 2020-09-24 ENCOUNTER — Ambulatory Visit: Payer: Medicare HMO | Admitting: Cardiology

## 2020-09-24 ENCOUNTER — Encounter: Payer: Self-pay | Admitting: Cardiology

## 2020-09-24 VITALS — BP 130/58 | HR 77 | Ht <= 58 in | Wt 167.2 lb

## 2020-09-24 DIAGNOSIS — E785 Hyperlipidemia, unspecified: Secondary | ICD-10-CM

## 2020-09-24 DIAGNOSIS — R0602 Shortness of breath: Secondary | ICD-10-CM | POA: Diagnosis not present

## 2020-09-24 DIAGNOSIS — I10 Essential (primary) hypertension: Secondary | ICD-10-CM

## 2020-09-24 NOTE — Patient Instructions (Signed)
Medication Instructions:  Your physician recommends that you continue on your current medications as directed. Please refer to the Current Medication list given to you today.  *If you need a refill on your cardiac medications before your next appointment, please call your pharmacy*  Testing/Procedures: Your physician has requested that you have an echocardiogram. Echocardiography is a painless test that uses sound waves to create images of your heart. It provides your doctor with information about the size and shape of your heart and how well your heart's chambers and valves are working. This procedure takes approximately one hour. There are no restrictions for this procedure.  This will be done at our [facility] location:  [place]: At [facility], you and your health needs are our priority.  As part of our continuing mission to provide you with exceptional heart care, we have created designated Provider Care Teams.  These Care Teams include your primary Cardiologist (physician) and Advanced Practice Providers (APPs -  Physician Assistants and Nurse Practitioners) who all work together to provide you with the care you need, when you need it.  We recommend signing up for the patient portal called "MyChart".  Sign up information is provided on this After Visit Summary.  MyChart is used to connect with patients for Virtual Visits (Telemedicine).  Patients are able to view lab/test results, encounter notes, upcoming appointments, etc.  Non-urgent messages can be sent to your provider as well.   To learn more about what you can do with MyChart, go to [email or web address].    Your next appointment:   12 month(s)  The format for your next appointment:   In Person  Provider:   Oswaldo Milian, MD

## 2020-10-27 ENCOUNTER — Other Ambulatory Visit: Payer: Self-pay

## 2020-10-27 ENCOUNTER — Ambulatory Visit (HOSPITAL_COMMUNITY): Payer: Medicare HMO | Attending: Cardiovascular Disease

## 2020-10-27 DIAGNOSIS — R0602 Shortness of breath: Secondary | ICD-10-CM | POA: Insufficient documentation

## 2020-10-27 LAB — ECHOCARDIOGRAM COMPLETE
Area-P 1/2: 3.85 cm2
S' Lateral: 2.1 cm

## 2020-12-05 IMAGING — US US RENAL
1 series · 14 of 25 positions shown · non-contrast
Comparison: None.

CLINICAL DATA: Initial evaluation for stage 4 chronic kidney
disease.

EXAM:
RENAL / URINARY TRACT ULTRASOUND COMPLETE

[Series 1: us renal · 0.23mm/px · 14 of 43 slices shown]
[im 1/43]
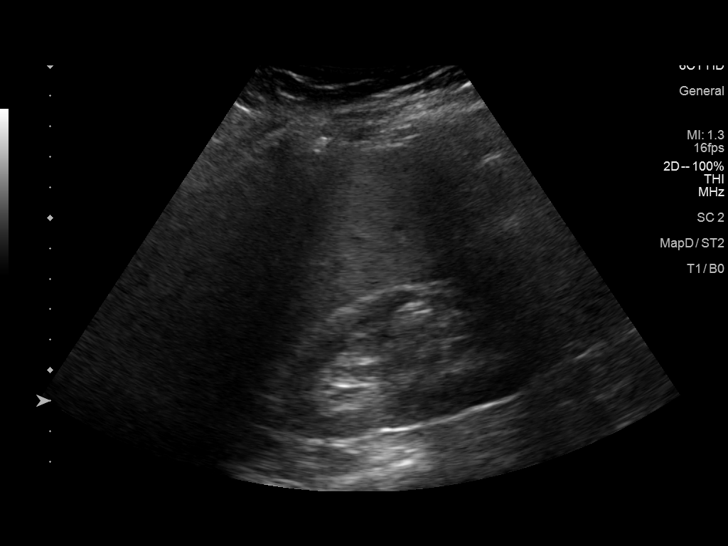
[im 4/43]
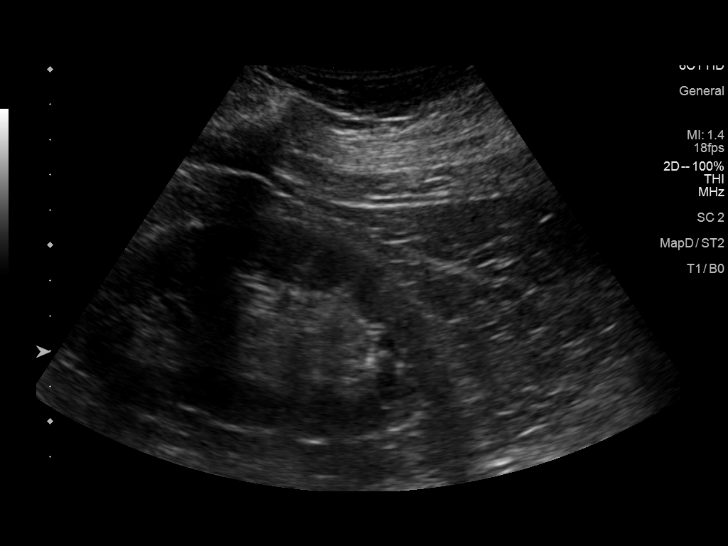
[im 8/43]
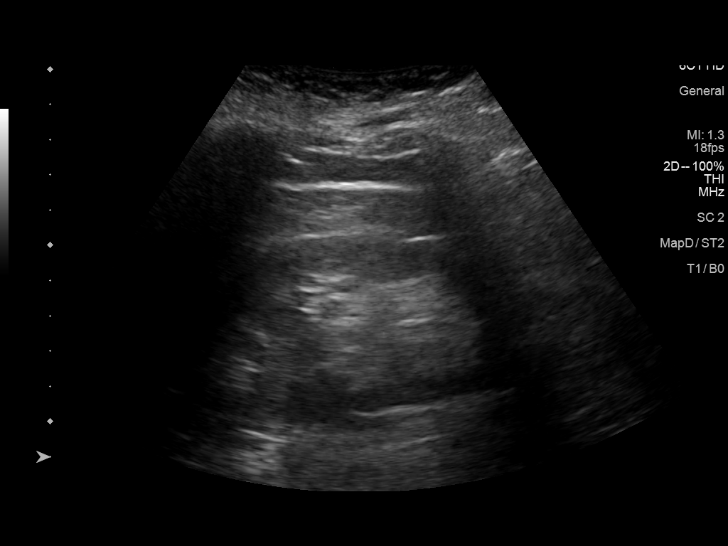
[im 11/43]
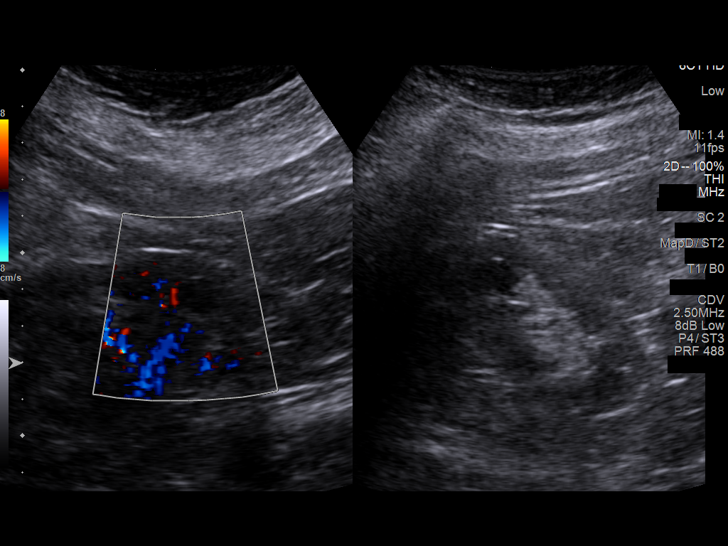
[im 15/43]
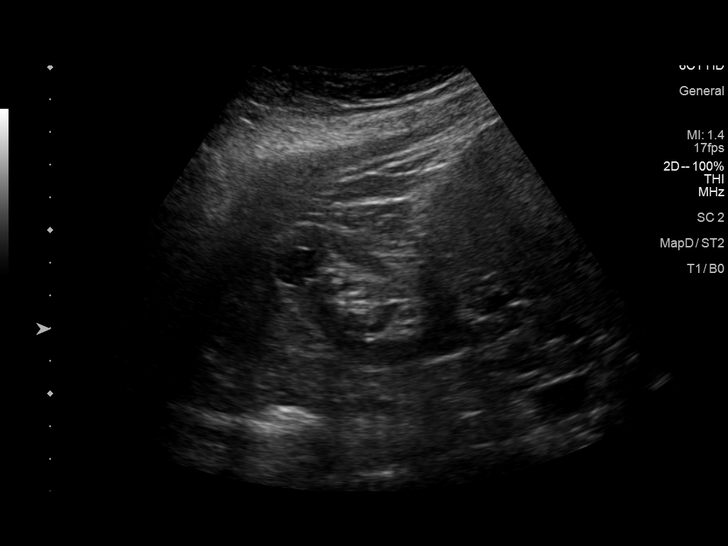
[im 16/43]
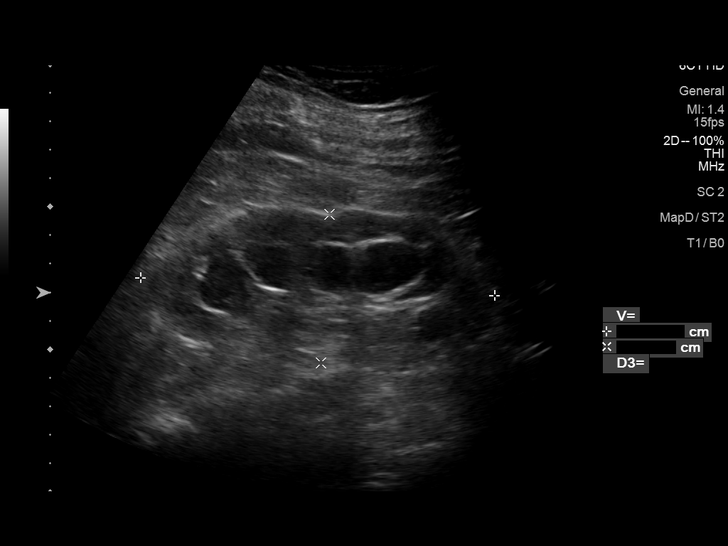
[im 20/43]
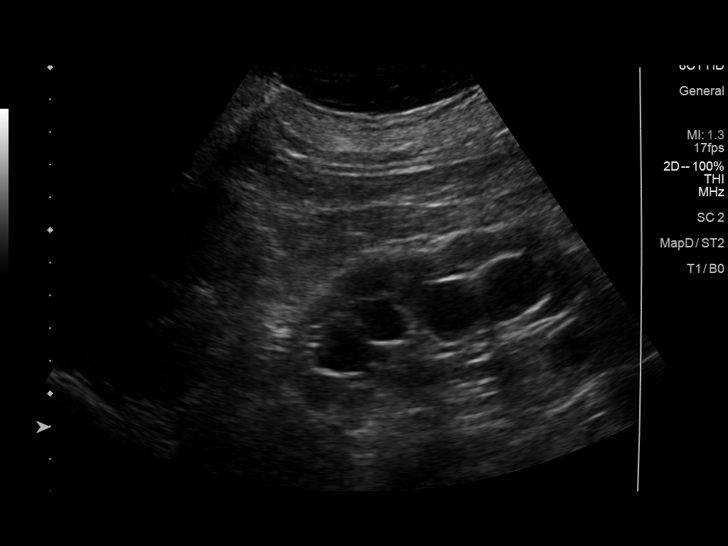
[im 23/43]
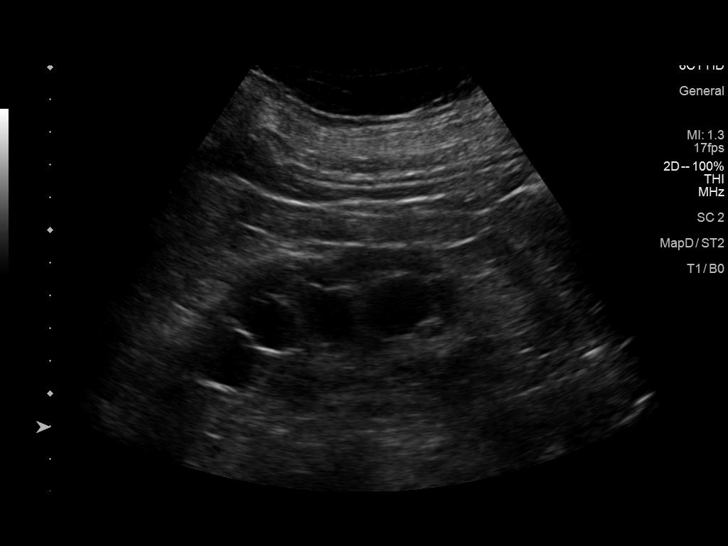
[im 27/43]
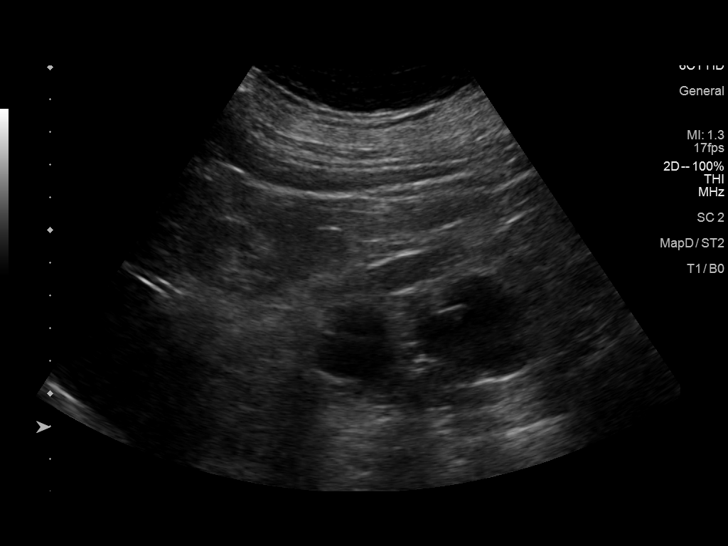
[im 29/43]
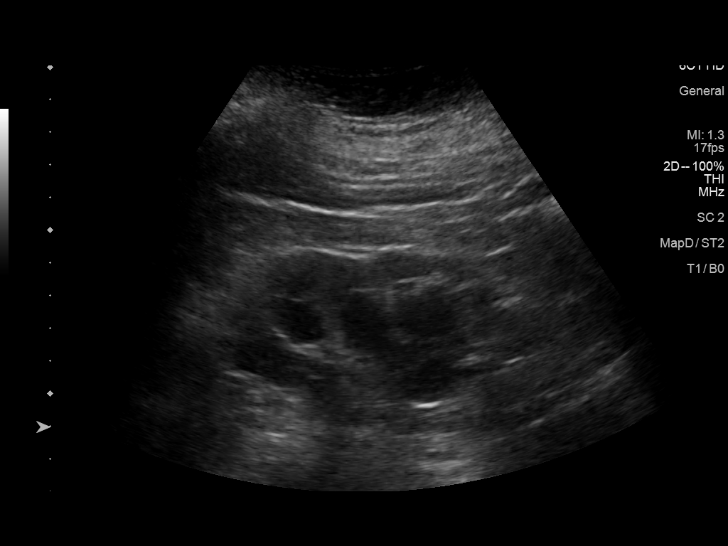
[im 32/43]
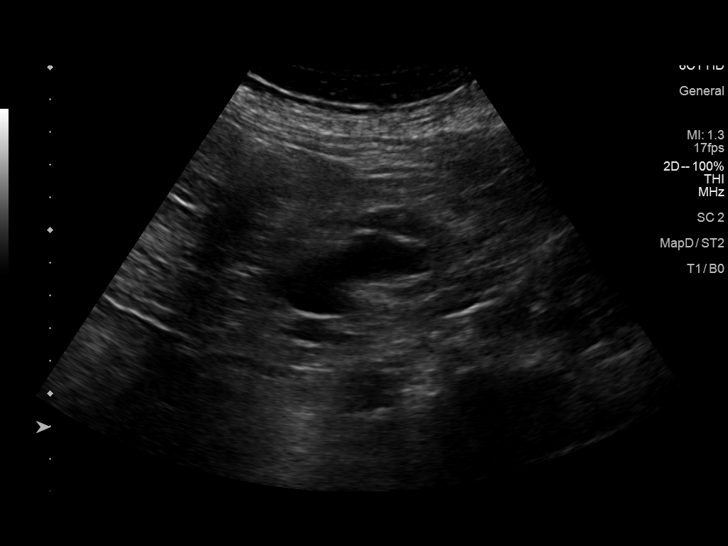
[im 36/43]
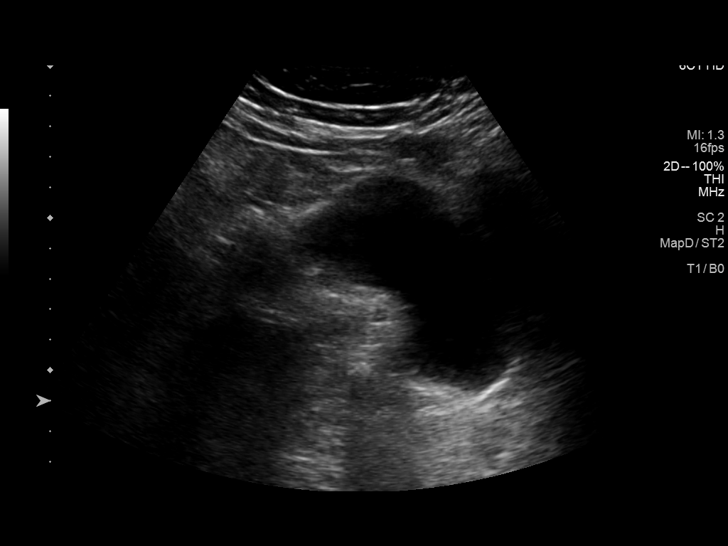
[im 39/43]
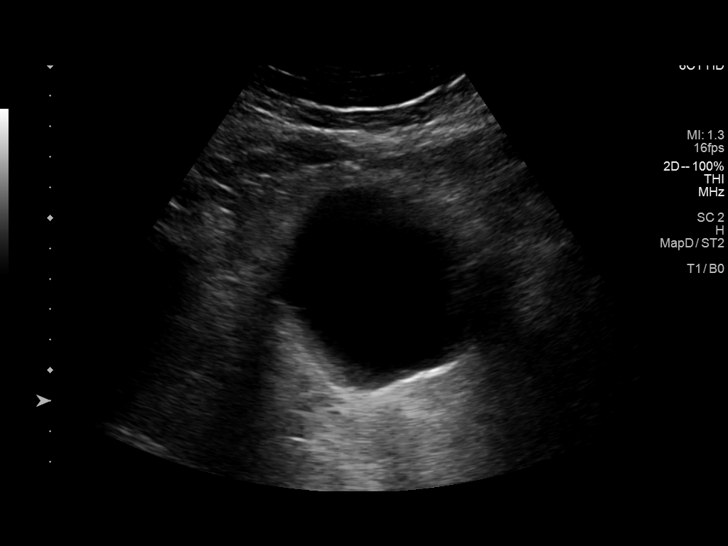
[im 43/43]
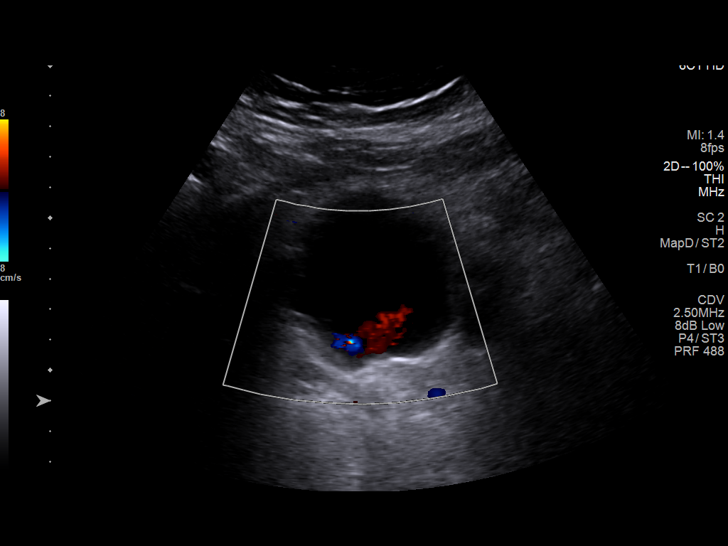

[14 of 25 positions shown; findings below may reference images not displayed]

FINDINGS: Right Kidney:

Renal measurements: 11.0 x 4.9 x 4.4 cm = volume: 125 mL. Chronic
cortical thinning with diffusely increased echogenicity within the
renal parenchyma, compatible with chronic medical renal disease. No
hydronephrosis. 1.0 x 1.0 x 1.0 cm simple cyst present at the lower
pole. No internal vascularity.

Left Kidney:

Renal measurements: 12.4 x 5.2 x 4.2 cm = volume: 141 mL. Chronic
cortical thinning with diffusely increased echogenicity within the
renal parenchyma, compatible with medical renal disease. No discrete
mass lesion. Moderate left-sided hydronephrosis, of uncertain
etiology.

Bladder:

Appears normal for degree of bladder distention. Bilateral ureteral
jets are visualized.
IMPRESSION: 1. Moderate left-sided hydronephrosis, of uncertain etiology. A left
ureteral jet is visualized at the bladder.
2. Underlying diffuse cortical thinning with increased echogenicity
within the renal parenchyma, compatible with chronic medical renal
disease.
3. 1 cm simple cyst at the lower pole of the right kidney.

## 2022-03-09 NOTE — Progress Notes (Unsigned)
No chief complaint on file.   HISTORY OF PRESENT ILLNESS:  03/09/22 ALL: Glenda Garcia returns for follow up for dementia. She was last seen 06/2020. MMSE was 14/30 at that time. We discussed starting memantine, however, she opted to continue healthy lifestyle habits. Unable to tolerate Aricept in the past due to mood changes. Since,   07/21/2020 ALL: Glenda Garcia is a 88 y.o. female here today for follow up for memory loss. Last seen 06/2018. Aricept made mood worse. Family was advised to consider memantine versus sertraline.  She presents with her daughter who aids in history today.  Over the past 2 years, memory has seemed to progressively decline.  She has seemed to adjust well to changes in living arrangements.  She is now living with her daughter.  She has more difficulty staying on task.  She is not oriented to time.  She does continue to perform activities of daily living independently.  May need some occasional guidance but able to complete task independently.  She exercises daily.  She likes to walk outside with her dog.  Her daughter converted their carport to an exercise room that she uses regularly.  She plays brain games and likes to put together puzzles at home.  She is sleeping well.  No difficulty eating but does have a decreased appetite.  She prefers sweets.  No difficulty with gait.  No falls.  She does not drive.  Her daughter places medications in organizer and she is able to administer independently.  She does have chronic kidney disease.  Primary care has weaned her off of some medications.  No other significant changes to medical history.   HISTORY (copied from previous note)  88 year old female here for evaluation of memory loss.   Patient was living in [facility], independently, when her PCP noted some memory loss problems.  She contacted family lives in [place] that they should, and take care of her.  Mild dementia was suspected.  Patient moved to  [facility] in October 2019.  Family has noted additional short-term memory loss problems.  Patient has had significant decline in activities of daily living.  She needs assistance with shopping, transportation, bills, medications.  She is not able to function independently outside of the home.   Patient tried on donepezil but family noted increasing mood changes and lability, and therefore this medication was stopped.   REVIEW OF SYSTEMS: Out of a complete 14 system review of symptoms, the patient complains only of the following symptoms, memory loss, decreased appetite and all other reviewed systems are negative.   ALLERGIES: No Known Allergies   HOME MEDICATIONS: Outpatient Medications Prior to Visit  Medication Sig Dispense Refill   amLODipine (NORVASC) 5 MG tablet Take 1 tablet (5 mg total) by mouth daily. 90 tablet 1   amoxicillin-clavulanate (AUGMENTIN) 250-125 MG tablet Take 1 tablet by mouth 3 (three) times daily.     metoprolol succinate (TOPROL-XL) 50 MG 24 hr tablet Take 1 tablet (50 mg total) by mouth daily. 90 tablet 3   simvastatin (ZOCOR) 10 MG tablet Take 1 tablet (10 mg total) by mouth daily at 6 PM. To lower cholesterol 90 tablet 3   Vitamin D, Cholecalciferol, 25 MCG (1000 UT) CAPS Take 1,000 Units by mouth every 3 (three) days.      No facility-administered medications prior to visit.     PAST MEDICAL HISTORY: Past Medical History:  Diagnosis Date   Arthritis    CKD (chronic kidney disease), stage  III ([place])    Dementia ([place])    Glomerulonephritis, chronic    Hyperlipidemia    Hypertension    Hypoparathyroidism ([place])    pt. denies at preop   Memory loss    Mild intermittent asthma    Pre-diabetes    hgb a1 c 03-26-19 6.0 epic   Prediabetes    Vitamin D deficiency      PAST SURGICAL HISTORY: Past Surgical History:  Procedure Laterality Date   CYSTOSCOPY WITH RETROGRADE PYELOGRAM, URETEROSCOPY AND STENT PLACEMENT Left 05/17/2019   Procedure: CYSTOSCOPY  WITH RETROGRADE PYELOGRAM, URETEROSCOPY AND STENT PLACEMENT;  Surgeon: Franchot Gallo, MD;  Location: [facility];  Service: Urology;  Laterality: Left;  1 HR 45 MINS   EYE SURGERY     cataract surgery   HOLMIUM LASER APPLICATION Left 00/92/3300   Procedure: HOLMIUM LASER APPLICATION;  Surgeon: Franchot Gallo, MD;  Location: [facility];  Service: Urology;  Laterality: Left;   PARTIAL HYSTERECTOMY     REPLACEMENT TOTAL KNEE Left      FAMILY HISTORY: Family History  Problem Relation Age of Onset   Hypertension Mother    Cancer Father    Healthy Sister    Healthy Son    Early death Son        MVA   Unexplained death Brother    Unexplained death Brother    Early death Sister        died at 22 months     SOCIAL HISTORY: Social History   Socioeconomic History   Marital status: Widowed    Spouse name: Not on file   Number of children: Not on file   Years of education: Not on file   Highest education level: Not on file  Occupational History   Not on file  Tobacco Use   Smoking status: Former    Types: Cigarettes   Smokeless tobacco: Never   Tobacco comments:    dont remember when quit so long ago  Vaping Use   Vaping Use: Never used  Substance and Sexual Activity   Alcohol use: Not Currently   Drug use: Never   Sexual activity: Not Currently  Other Topics Concern   Not on file  Social History Narrative   Not on file   Social Determinants of Health   Financial Resource Strain: Not on file  Food Insecurity: Not on file  Transportation Needs: Not on file  Physical Activity: Not on file  Stress: Not on file  Social Connections: Not on file  Intimate Partner Violence: Not on file      PHYSICAL EXAM  There were no vitals filed for this visit.  There is no height or weight on file to calculate BMI.   Generalized: Well developed, in no acute distress  Cardiology: normal rate and rhythm, no murmur auscultated  Respiratory: clear to auscultation bilaterally     Neurological examination  Mentation: Alert, she is not oriented to time, or place, but is able to aid in some history taking. Follows all commands speech and language fluent Cranial nerve II-XII: Pupils were equal round reactive to light. Extraocular movements were full, visual field were full on confrontational test. Facial sensation and strength were normal. Head turning and shoulder shrug  were normal and symmetric. Motor: The motor testing reveals 5 over 5 strength of all 4 extremities. Good symmetric motor tone is noted throughout.  Sensory: Sensory testing is intact to soft touch on all 4 extremities. No evidence of extinction is noted.  Coordination:  Cerebellar testing reveals good finger-nose-finger and heel-to-shin bilaterally.  Gait and station: Gait is normal.     DIAGNOSTIC DATA (LABS, IMAGING, TESTING) - I reviewed patient records, labs, notes, testing and imaging myself where available.  Lab Results  Component Value Date   WBC 9.6 05/14/2019   HGB 14.0 05/14/2019   HCT 46.1 (H) 05/14/2019   MCV 95.4 05/14/2019   PLT 223 05/14/2019      Component Value Date/Time   NA 137 05/14/2019 1359   NA 142 03/26/2019 0941   NA 138 05/16/2018 0000   K 4.7 05/14/2019 1359   K 3.8 05/16/2018 0000   CL 107 05/14/2019 1359   CL 101 05/16/2018 0000   CO2 22 05/14/2019 1359   CO2 27 05/16/2018 0000   GLUCOSE 81 05/14/2019 1359   BUN 27 (H) 05/14/2019 1359   BUN 25 03/26/2019 0941   CREATININE 1.61 (H) 05/14/2019 1359   CALCIUM 9.1 05/14/2019 1359   CALCIUM 9.0 05/16/2018 0000   PROT 7.3 03/26/2019 0941   ALBUMIN 4.2 03/26/2019 0941   ALBUMIN 4.0 12/28/2017 0000   AST 21 03/26/2019 0941   AST 16 12/28/2017 0000   ALT 14 03/26/2019 0941   ALT 12 12/28/2017 0000   ALKPHOS 72 03/26/2019 0941   ALKPHOS 73 12/28/2017 0000   BILITOT 0.3 03/26/2019 0941   GFRNONAA 29 (L) 05/14/2019 1359   GFRAA 34 (L) 05/14/2019 1359   GFRAA 27 05/16/2018 0000   Lab Results  Component  Value Date   CHOL 138 03/26/2019   HDL 40 03/26/2019   LDLCALC 66 03/26/2019   TRIG 191 (H) 03/26/2019   CHOLHDL 3.5 03/26/2019   Lab Results  Component Value Date   HGBA1C 6.0 (H) 03/26/2019   No results found for: "VITAMINB12" Lab Results  Component Value Date   TSH 0.758 06/12/2018       07/21/2020    7:29 AM 07/24/2018   10:21 AM  MMSE - Mini Mental State Exam  Orientation to time 0 3  Orientation to Place 0 4  Registration 3 3  Attention/ Calculation 2 2  Recall 0 1  Language- name 2 objects 2 2  Language- repeat 1 1  Language- follow 3 step command 3 3  Language- read & follow direction 1 1  Write a sentence 1 1  Copy design 1 1  Total score 14 22     ASSESSMENT AND PLAN  88 y.o. year old female  has a past medical history of Arthritis, CKD (chronic kidney disease), stage III ([place]), Dementia ([place]), Glomerulonephritis, chronic, Hyperlipidemia, Hypertension, Hypoparathyroidism ([place]), Memory loss, Mild intermittent asthma, Pre-diabetes, Prediabetes, and Vitamin D deficiency. here with   No diagnosis found.  Glenda Garcia is doing fairly well today from a physical standpoint.  Memory loss has continued to decline.  Previous MMSE 22 of 30, today it is 14 of 30.  She is not oriented to place or time.  She appears happy.  She is able to complete most ADLs independently.  We have discussed progression of disease process.  She was unable to tolerate Aricept in the past.  We have discussed benefits and risk of Namenda.  She is uncertain if she wishes to start any medication at this time.  I provided additional educational information in her AVS.  I have advised her to review this with primary care and any other members of her care team or family that she wishes.  I have encouraged her to continue memory compensation strategies,  regular physical exercise, and well-balanced diet.  She may call me if she wishes to start [place].  Otherwise, follow-up as needed.  She and her daughter  verbalized understanding and agreement with this plan.  No orders of the defined types were placed in this encounter.   Debbora Presto, MSN, FNP-C 03/09/2022, 9:02 AM  [facility] [address] [place], [place] [address] [phone]

## 2022-03-09 NOTE — Patient Instructions (Signed)
Below is our plan:  We will continue to monitor symptoms. You can consider melatonin 3mg  at bedtime to see if this helps with maintaining sleep. May need to increase dose to 5mg .   Please make sure you are staying well hydrated. I recommend 50-60 ounces daily. Well balanced diet and regular exercise encouraged. Consistent sleep schedule with 6-8 hours recommended.   Please continue follow up with care team as directed.   Follow up with me as needed   You may receive a survey regarding today's visit. I encourage you to leave honest feed back as I do use this information to improve patient care. Thank you for seeing me today!   Management of Memory Problems   There are some general things you can do to help manage your memory problems.  Your memory may not in fact recover, but by using techniques and strategies you will be able to manage your memory difficulties better.   1)  Establish a routine. Try to establish and then stick to a regular routine.  By doing this, you will get used to what to expect and you will reduce the need to rely on your memory.  Also, try to do things at the same time of day, such as taking your medication or checking your calendar first thing in the morning. Think about think that you can do as a part of a regular routine and make a list.  Then enter them into a daily planner to remind you.  This will help you establish a routine.   2)  Organize your environment. Organize your environment so that it is uncluttered.  Decrease visual stimulation.  Place everyday items such as keys or cell phone in the same place every day (ie.  Basket next to front door) Use post it notes with a brief message to yourself (ie. Turn off light, lock the door) Use labels to indicate where things go (ie. Which cupboards are for food, dishes, etc.) Keep a notepad and pen by the telephone to take messages   3)  Memory Aids A diary or journal/notebook/daily planner Making a list (shopping  list, chore list, to do list that needs to be done) Using an alarm as a reminder (kitchen timer or cell phone alarm) Using cell phone to store information (Notes, Calendar, Reminders) Calendar/White board placed in a prominent position Post-it notes   In order for memory aids to be useful, you need to have good habits.  It's no good remembering to make a note in your journal if you don't remember to look in it.  Try setting aside a certain time of day to look in journal.   4)  Improving mood and managing fatigue. There may be other factors that contribute to memory difficulties.  Factors, such as anxiety, depression and tiredness can affect memory. Regular gentle exercise can help improve your mood and give you more energy. Simple relaxation techniques may help relieve symptoms of anxiety Try to get back to completing activities or hobbies you enjoyed doing in the past. Learn to pace yourself through activities to decrease fatigue. Find out about some local support groups where you can share experiences with others. Try and achieve 7-8 hours of sleep at night.

## 2022-03-10 ENCOUNTER — Encounter: Payer: Self-pay | Admitting: Family Medicine

## 2022-03-10 ENCOUNTER — Ambulatory Visit: Payer: Medicare HMO | Admitting: Family Medicine

## 2022-03-10 VITALS — BP 141/61 | HR 73 | Ht <= 58 in | Wt 168.0 lb

## 2022-03-10 DIAGNOSIS — F0280 Dementia in other diseases classified elsewhere without behavioral disturbance: Secondary | ICD-10-CM | POA: Diagnosis not present

## 2022-03-10 DIAGNOSIS — G301 Alzheimer's disease with late onset: Secondary | ICD-10-CM | POA: Diagnosis not present

## 2022-03-22 ENCOUNTER — Telehealth: Payer: Self-pay

## 2022-03-22 NOTE — Telephone Encounter (Signed)
Adult day care/[facility] medical examination report forms compled, placed in POD 1 for NP review and signature.

## 2023-02-25 NOTE — Progress Notes (Signed)
 [facility], [place]  - [facility]  [address] [place] [address]                                      02/26/23 Patient name: Glenda Garcia  Date of birth: 1933/07/15   SUBJECTIVE:  Chief Complaint  Patient presents with  . Discuss FL2    [name] is a 89 y.o. female comes in today for chronic disease management and to discuss FL2 paperwork.  Patient is accompanied by her son and her daught in law.  Her daughter-in-law takes care of her medications.  Currently patient lives at home, she goes to [facility] care 3 days in the week.  Her son and daughter-in-law are going to find a skilled nursing facility with memory care due to patient's advanced dementia.  Patient uses a cane or a walker to ambulate at home.  Her daughter-in-law reports, patient home BP around 120s to 130s over 70s.  Patient has chronic kidney disease stage III/IV, she follows [facility].   BP Readings from Last 3 Encounters:  02/25/23 143/70  01/18/23 122/70  08/24/22 145/70    Lab Results  Component Value Date   HGBA1C 5.9 (H) 02/25/2023   HGBA1C 5.8 (H) 12/08/2021   HGBA1C 5.8 (H) 12/08/2021    No results found for: [place]  Lab Results  Component Value Date   CHOL 187 05/04/2021   HDL 46 05/04/2021   TRIG 190 (H) 05/04/2021      The ASCVD Risk score (Arnett DK, et al., 2019) failed to calculate for the following reasons:   The 2019 ASCVD risk score is only valid for ages 30 to 72  HISTORY: Ihave reviewed the allergies, current medications, past medical and surgical history, family and social history, problem list, and updated as needed.  Allergies No Known Allergies  Medications Current Outpatient Medications  Medication Sig Dispense Refill  . albuterol  sulfate (ProAir  RespiClick) 90 mcg/actuation aepb inhaler Inhale 2 puffs every 6 (six) hours as needed. 1 each 5  . amLODIPine  (NORVASC ) 5 mg tablet Take 5  mg by mouth Once Daily. 30 tablet 0  . cephALEXin  (KEFLEX ) 500 mg capsule Take 1 capsule by mouth in the morning and 1 capsule in the evening.    . cholecalciferol (VITAMIN D3) 1,000 unit (25 mcg) tablet Take 1,000 Units by mouth every other day.    . metoprolol  succinate (TOPROL  XL) 25 mg 24 hr tablet Take 25 mg by mouth Once Daily. 90 tablet 3  . multivitamin (THERAGRAN) tab tablet Take 1 tablet by mouth Once Daily.    . simvastatin  (ZOCOR ) 10 mg tablet Take 10 mg by mouth Once Daily. 90 tablet 3   No current facility-administered medications for this visit.    Family history Family History  Problem Relation Name Age of Onset  . Throat cancer Father      Social history Social History   Tobacco Use  Smoking Status Former  . Current packs/day: 0.00  . Types: Cigarettes  . Quit date: 06/28/1973  . Years since quitting: 49.6  Smokeless Tobacco Never    Surgical history Past Surgical History:  Procedure Laterality Date  . LITHOTRIPSY     Procedure: LITHOTRIPSY    Preventive Care Health Maintenance  Topic Date Due  . SDOH Assessment  Never done  . DTaP/Tdap/Td Vaccines (1 - Tdap) Never done  .  ZOSTER VACCINE (1 of 2) Never done  . COVID-19 Vaccine (9 - 2023-24 season) 08/03/2022  . Influenza Vaccine (1) 01/27/2023  . Comprehensive Annual Visit  08/25/2023  . Medicare Annual Wellness (AWV) Subsequent Visits  08/25/2023  . Depression Screening  08/25/2023  . Diabetes Screening  02/25/2024  . Bone Density Scan  08/24/2024  . Adult RSV (60+ Years or Pregnancy)  Completed  . Pneumococcal Vaccine for Ages 65+  Completed  . HIB Vaccines  Aged Out  . Hepatitis B Vaccines  Aged Out  . IPV Vaccines  Aged Out  . Hepatitis A Vaccines  Aged Out  . Meningococcal Conjugate (ACWY) Vaccine  Aged Out  . Rotavirus Vaccines  Aged Out  . HPV Vaccines  Aged Out  . Medicare Annual Wellness (AWV) Initial Visit  Discontinued     Immunization History  Administered Date(s) Administered   . Influenza Vaccine, Quadrivalent, Adjuvanted 03/06/2019, 03/25/2021  . Influenza, High-dose Seasonal, Quadrivalent, Preservative Free 03/25/2022  . Influenza, high-dose, trivalent, PF 05/08/2018  . Moderna Covid-19, mRNA,LNP-S,PF 12+ Yrs 04/02/2022  . Moderna SARS-CoV-2 Primary Series 12+ yrs 08/11/2019, 08/29/2019, 09/08/2019  . Pfizer SARS-CoV-2 Bivalent 12+ yrs 03/25/2021, 11/30/2021  . Pfizer SARS-CoV-2 Primary Series 12+ yrs 04/30/2020, 10/20/2020  . Pneumococcal Conjugate 13-Valent 04/10/2015, 08/30/2018  . Pneumococcal Polysaccharide 04/20/2004, 10/30/2020  . RSV, Recombinant, PF 04/02/2022     ROS  Review of Systems  Constitutional:  Negative for appetite change, chills and fever.  Eyes:  Negative for visual disturbance.       SP cataract surgery bilateral.  Respiratory:  Negative for chest tightness, shortness of breath and wheezing.   Cardiovascular:  Positive for leg swelling. Negative for chest pain and palpitations.       Dependent leg swelling by the end of the day.  Gastrointestinal:  Negative for abdominal pain, blood in stool, diarrhea, nausea and vomiting.  Genitourinary:  Negative for difficulty urinating, dysuria, flank pain and hematuria.       Urinary incontinence.  Skin:  Negative for rash and wound.  Neurological:  Negative for dizziness, seizures, syncope and headaches.  Psychiatric/Behavioral:  Positive for confusion. Negative for agitation, behavioral problems, hallucinations, self-injury and suicidal ideas.        Dementia and memory loss.  All other systems reviewed and are negative.   Review of Systems - All other systems reviewed are negative except as noted above.  OBJECTIVE  BP 143/70 (BP Location: Right arm)   Pulse 70   Ht 1.524 m (5')   Wt 79.4 kg (175 lb)   SpO2 98%   BMI 34.18 kg/m    PHYSICAL:  Physical Exam Vitals and nursing note reviewed.  Constitutional:      General: She is not in acute distress.    Appearance: Normal  appearance.  HENT:     Head: Normocephalic and atraumatic.  Eyes:     Pupils: Pupils are equal, round, and reactive to light.  Neck:     Vascular: No carotid bruit.  Cardiovascular:     Rate and Rhythm: Normal rate and regular rhythm.     Heart sounds: No murmur heard.    No friction rub. No gallop.  Pulmonary:     Effort: Pulmonary effort is normal. No respiratory distress.     Breath sounds: No wheezing, rhonchi or rales.  Abdominal:     General: Bowel sounds are normal.     Palpations: Abdomen is soft.     Tenderness: There is no abdominal tenderness.  Musculoskeletal:     Cervical back: Normal range of motion and neck supple.     Comments: Trace pitting edema of lower extremities bilateral.  Skin:    General: Skin is warm.     Findings: No rash.  Neurological:     General: No focal deficit present.     Mental Status: She is alert.     Comments: Orientated to name and place.  Disorientated to time.  Psychiatric:        Mood and Affect: Mood normal.        Behavior: Behavior normal.    ASSESSMENT/PLAN:  1. Essential hypertension Stable, continue treatment. - CBC without Differential; Future - Comprehensive Metabolic Panel; Future  2. Stage 3b chronic kidney disease (HCC) Stay well-hydrated, avoid nephrotoxin, follow-up with [facility]. - Comprehensive Metabolic Panel; Future  3. Late onset Alzheimer's dementia without behavioral disturbance (HCC 4. Dementia without behavioral disturbance [facility]) Follow-up with neurology, patient will move to skilled nursing facility with memory care. Will fill out the [facility] 2 form. - TSH With Reflex To Free T4; Future - Vitamin B12; Future - Vitamin D , 25-Hydroxy; Future  5. Prediabetes Continue low sugar, low carbs diet and exercise. - Hemoglobin A1C With Estimated Average Glucose; Future  6. Hyperlipidemia, unspecified hyperlipidemia type Continue simvastatin .  7. Urinary incontinence, unspecified type Patient uses  diaper due to urinary incontinence.   Return in about 6 months (around 08/26/2023) for follow up.  Goals of care discussed with patient including medication compliance and adequate follow up. Patient verbalizesunderstanding and in agreement with the above plan. All questions answered.   I agree the documentation is accurate and complete.   No future appointments.  This document serves as a record of services personally performed by Dr. Cesario.  It was created on their behalf by Ronal LITTIE Ring, CMA, a trained medical scribe, and Certified Medical Assistant (CMA). During the course of documenting the history, physical exam and medical decision making, I was functioning as a Stage manager. The creation of this record is the provider's dictation and/or activities during the visit.     This document was created using the aidof voice recognition Scientist, clinical (histocompatibility and immunogenetics).

## 2023-07-03 NOTE — Progress Notes (Deleted)
 Cardiology Office Note:    Date:  07/03/2023   ID:  Glenda Garcia, DOB 1934/01/10, MRN [id number]  PCP:  Cesario Mutton, MD  Cardiologist:  None  Electrophysiologist:  None   Referring MD: Cesario Mutton, MD   No chief complaint on file.   History of Present Illness:    Glenda Garcia is a 89 y.o. female with a hx of CKD stage III, hypertension, hyperlipidemia, prediabetes, dementia who presents for follow-up.  She was referred by Dr. Raylene for preoperative evaluation on 04/25/2019, was planning cystoscopy, ureteroscopy, laser lithotripsy, stone extraction and stent placement.  No further cardiac work-up was recommended prior to procedure.  Echocardiogram 10/27/2020 showed normal biventricular function, no significant valvular disease.  Since last clinic visit, reports has been having shortness of breath with walking short distances.  Son reports that she goes out for walks about 3 times a week for 10 minutes, but has been having to take a break after 5 minutes due to shortness of breath.  Denies any chest pain.  Denies any lightheadedness, syncope, lower extremity edema, or palpitations.   Past Medical History:  Diagnosis Date   Arthritis    CKD (chronic kidney disease), stage III (HCC)    Dementia (HCC)    Glomerulonephritis, chronic    Hyperlipidemia    Hypertension    Hypoparathyroidism (HCC)    pt. denies at preop   Memory loss    Mild intermittent asthma    Pre-diabetes    hgb a1 c 03-26-19 6.0 epic   Prediabetes    Vitamin D  deficiency     Past Surgical History:  Procedure Laterality Date   CYSTOSCOPY WITH RETROGRADE PYELOGRAM, URETEROSCOPY AND STENT PLACEMENT Left 05/17/2019   Procedure: CYSTOSCOPY WITH RETROGRADE PYELOGRAM, URETEROSCOPY AND STENT PLACEMENT;  Surgeon: Matilda Senior, MD;  Location: [facility];  Service: Urology;  Laterality: Left;  1 HR 45 MINS   EYE SURGERY     cataract surgery   HOLMIUM LASER APPLICATION Left 05/17/2019   Procedure: HOLMIUM LASER  APPLICATION;  Surgeon: Matilda Senior, MD;  Location: [facility];  Service: Urology;  Laterality: Left;   PARTIAL HYSTERECTOMY     REPLACEMENT TOTAL KNEE Left     Current Medications: No outpatient medications have been marked as taking for the 07/07/23 encounter (Appointment) with Kate Lonni CROME, MD.     Allergies:   Patient has no known allergies.   Social History   Socioeconomic History   Marital status: Widowed    Spouse name: Not on file   Number of children: Not on file   Years of education: Not on file   Highest education level: Not on file  Occupational History   Not on file  Tobacco Use   Smoking status: Former    Types: Cigarettes   Smokeless tobacco: Never   Tobacco comments:    dont remember when quit so long ago  Vaping Use   Vaping status: Never Used  Substance and Sexual Activity   Alcohol use: Not Currently   Drug use: Never   Sexual activity: Not Currently  Other Topics Concern   Not on file  Social History Narrative   Not on file   Social Drivers of Health   Financial Resource Strain: Not on file  Food Insecurity: Not on file  Transportation Needs: Not on file  Physical Activity: Not on file  Stress: Not on file  Social Connections: Not on file     Family History: The patient's family history includes  Cancer in her father; Early death in her sister and son; Healthy in her sister and son; Hypertension in her mother; Unexplained death in her brother and brother.  ROS:   Please see the history of present illness.    All other systems reviewed and are negative.  EKGs/Labs/Other Studies Reviewed:    The following studies were reviewed today:   EKG:  EKG is ordered today.  The ekg ordered today demonstrates NSR, rate 77, No ST/T abnormalities  Recent Labs: No results found for requested labs within last 365 days.  Recent Lipid Panel    Component Value Date/Time   CHOL 138 03/26/2019 0941   CHOL 117 12/28/2017 0000   TRIG 191 (H)  03/26/2019 0941   TRIG 149 12/28/2017 0000   HDL 40 03/26/2019 0941   HDL 39 12/28/2017 0000   CHOLHDL 3.5 03/26/2019 0941   CHOLHDL 3.0 12/28/2017 0000   LDLCALC 66 03/26/2019 0941   LDLCALC 55 12/28/2017 0000    Physical Exam:    VS:  There were no vitals taken for this visit.    Wt Readings from Last 3 Encounters:  03/10/22 168 lb (76.2 kg)  09/24/20 167 lb 3.2 oz (75.8 kg)  07/21/20 174 lb 3.2 oz (79 kg)     GEN: Well nourished, well developed in no acute distress HEENT: Normal NECK: No JVD CARDIAC: RRR, no murmurs, rubs, gallops RESPIRATORY:  Clear to auscultation without rales, wheezing or rhonchi  ABDOMEN: Soft, non-tender, non-distended MUSCULOSKELETAL:  No edema; No deformity  SKIN: Warm and dry NEUROLOGIC:  Alert and oriented x 2, did not know the year PSYCHIATRIC:  Normal affect   ASSESSMENT:    No diagnosis found.  PLAN:    In order of problems listed above:  Dyspnea on exertion: Could be related to deconditioning.  Echocardiogram 10/27/2020 showed normal biventricular function, no significant valvular disease.  Hypertension: On amlodipine  5 mg daily, Toprol -XL 50 mg daily.  Appears controlled  Hyperlipidemia: Most recent LDL 66.  On simvastatin  10 mg daily  RTC in 1 year  Medication Adjustments/Labs and Tests Ordered: Current medicines are reviewed at length with the patient today.  Concerns regarding medicines are outlined above.  No orders of the defined types were placed in this encounter.  No orders of the defined types were placed in this encounter.   There are no Patient Instructions on file for this visit.   Signed, Lonni LITTIE Nanas, MD  07/03/2023 2:55 PM    [facility]

## 2023-07-07 ENCOUNTER — Ambulatory Visit: Payer: Medicare HMO | Admitting: Cardiology

## 2023-08-02 NOTE — Progress Notes (Signed)
 Physical Therapy Visit - Daily Note   Payor: [employer] MEDICARE ADV / Plan: [employer] [place] / Product Type: Medicare Advantage /   Visit Count: 5  Rehabilitation Precautions/Restrictions:   Precautions/Restrictions Precautions: alzheimers, HTN, hyperlipidemia, fall risk        Referring Diagnosis: R26.9 (ICD-10-CM) - Gait abnormality   SUBJECTIVE Patient Report:   Per patient family member patient is doing well with no issues. Reports that they continue to walk more at home.  Change in Status Since Last Visit: No Pain:  Pain Assessment Pain Assessment: 0-10 Pain Score  : 0  OBJECTIVE  General Observation/Objective Measures:    Ambulating independently mod impaired dynamic gait with RW accompanied by family member  Requires contact guard/BUE support for SLS/dynamic balance activities  Requires cuing for therapeutic exercise        Interventions:    Therapeutic exercise x 45' Review HEP Combo bike x 6' for warmup/endurance Marching on airex x 30 BUE support Stair ambulation x 2 laps with BUE support and contact guard Bil heel raise x 30 BUE support Sit to stand at bench with yellow med ball x 20 SLS alternating BLE on airex x 2' Hamstring curls x 15 20 lbs Knee extension x 20 20 lbs Leg press x 30 partial ROM 40 lbs Hip abduction x 20 on airex BUE support, BLE Hip extension x 20 on airex, BUE support, BLE Step up on 6 inch step alternating BLE x 20 Bue support  Education: Yes, as described in interventions   ASSESSMENT    Patient with continued impaired core/BLE strength/endurance and impaired aerobic endurance at this time. Patient with moderately to significantly impaired dynamic gait at this time. Patient with difficulty prolonged standing/walking at this time. Patient progressed with resisted BLE strengthening activities today. Tolerated treatment well. Patient to continue to benefit from skilled physical therapy services to address musculoskeletal and functional  impairments to facilitate return to prior functional mobility, decrease pain, and improve quality of life.   Therapy Diagnosis:     ICD-10-CM   1. Muscle weakness (generalized)  M62.81   2. Gait abnormality  R26.9   3. Difficulty in walking  R26.2     Progress Towards Goals:     Goals Addressed             This Visit's Progress   . PT Goal       Short Term Goals (STG) - Time Frame 2/3  STG 1: pt to demonstrate compliance with HEP  STG 2: pt to demonstrate 10 sec BSLS with decreased BUE support to facilitate decreased fall risk   Long Term Goals (LTG) - Time Frame 3/31  LTG 1: pt to report at least 60% confidence with ABC  LTG 2: pt to demonstrate 5/5 BLE MMT to facilitate return to prior functional mobility   LTG 3: pt to demonstrate 30 secs BSLS with decreased BUE support to facilitate decreased fall risk  LTG 4: pt to ambulate at least 4 laps in clinic with least restrictive device and more normalized gait without evidence of imbalance          PLAN Treatment Frequency and Duration:  PT Frequency: Other (comment) PT Duration: Other (comment) Treatment Plan Details: recommending 2 x a week decrease to 1 x a week as needed up to 30 visits, 09/26/2023  Recommended PT Treatment/Interventions: Electrical stimulation-attended (02967); Electrical stimulation-unattended (02985); Gait training (02883); Iontophoresis [phone]); Manual therapy (97140); Neuromuscular re-education [phone]); Physical Performance Testing (02249); Therapeutic activity (97530); Therapeutic exercise (  02889); Ultrasound (02964)   Recommended Consults:  None currently  Development of Plan of Care:  No change in POC.  Total Treatment Time (Time & Untimed): Total Treatment Time: 45 Total Time in Timed Codes: Time in Timed Codes: 45     Treatment/Procedures Therapeutic Exercises minutes: 45            The patient has been instructed to contact our clinic if any questions or problems should  arise.

## 2023-08-05 NOTE — Progress Notes (Signed)
 Physical Therapy Visit - Daily Note   Payor: [employer] MEDICARE ADV / Plan: [employer] [place] / Product Type: Medicare Advantage /   Visit Count: 6  Rehabilitation Precautions/Restrictions:   Precautions/Restrictions Precautions: alzheimers, HTN, hyperlipidemia, fall risk        Referring Diagnosis: R26.9 (ICD-10-CM) - Gait abnormality   SUBJECTIVE Patient Report:   Patient with compliance with HEP. Patient's family member says that patient continues to walk a lot at home.  Change in Status Since Last Visit: No Pain:  Pain Assessment Pain Assessment: 0-10 Pain Score  : 0  OBJECTIVE  General Observation/Objective Measures:    Ambulating independently mod impaired dynamic gait with RW accompanied by family member  Requires contact guard/BUE support for SLS/dynamic balance activities  Requires cuing for therapeutic exercise        Interventions:    Therapeutic exercise x 45' Review HEP Combo bike x 6' for warmup/endurance Marching on airex x 30 BUE support Bil heel raise x 30 BUE support Sit to stand at bench x 20 SLS alternating BLE on blue therapad x 2' Hamstring curls x 15 25 lbs Knee extension x 20 25 lbs Leg press x 30 20 lbs Hip abduction x 20 on airex BUE support, BLE Hip extension x 20 on airex, BUE support, BLE Toe taps x 30 onto 12 inch step BUE Rows x 20 double gray tband  Education: Yes, as described in interventions   ASSESSMENT   Progressed with increased resistance for knee extension/hamstring curls. Also progressed with leg press at full hip/knee ROM. Patient with moderate fatigue at end of today's visit. Patient with continued impaired standing/walking endurance. Patient with continued impaired core/BLE strength/endurance and impaired dynamic gait noted at this time. Patient tolerated treatment well.  Patient to continue to benefit from skilled physical therapy services to facilitate return to prior functional mobility, decrease pain, and improve quality  of life.   Therapy Diagnosis:     ICD-10-CM   1. Muscle weakness (generalized)  M62.81   2. Gait abnormality  R26.9   3. Difficulty in walking  R26.2     Progress Towards Goals:     Goals Addressed             This Visit's Progress   . PT Goal       Short Term Goals (STG) - Time Frame 2/3  STG 1: pt to demonstrate compliance with HEP  STG 2: pt to demonstrate 10 sec BSLS with decreased BUE support to facilitate decreased fall risk   Long Term Goals (LTG) - Time Frame 3/31  LTG 1: pt to report at least 60% confidence with ABC  LTG 2: pt to demonstrate 5/5 BLE MMT to facilitate return to prior functional mobility   LTG 3: pt to demonstrate 30 secs BSLS with decreased BUE support to facilitate decreased fall risk  LTG 4: pt to ambulate at least 4 laps in clinic with least restrictive device and more normalized gait without evidence of imbalance          PLAN Treatment Frequency and Duration:  PT Frequency: Other (comment) PT Duration: Other (comment) Treatment Plan Details: recommending 2 x a week decrease to 1 x a week as needed up to 30 visits, 09/26/2023  Recommended PT Treatment/Interventions: Electrical stimulation-attended (02967); Electrical stimulation-unattended (02985); Gait training (02883); Iontophoresis [phone]); Manual therapy (97140); Neuromuscular re-education [phone]); Physical Performance Testing (02249); Therapeutic activity (97530); Therapeutic exercise (97110); Ultrasound (02964)   Recommended Consults:  None currently  Development of Plan  of Care:  No change in POC.  Total Treatment Time (Time & Untimed): Total Treatment Time: 45 Total Time in Timed Codes: Time in Timed Codes: 45     Treatment/Procedures Therapeutic Exercises minutes: 45            The patient has been instructed to contact our clinic if any questions or problems should arise.

## 2023-09-01 NOTE — Progress Notes (Deleted)
 No chief complaint on file.   HISTORY OF PRESENT ILLNESS:  09/01/23 ALL: Glenda Garcia returns for follow up for dementia. She was last seen by me 02/2022. MMSE 12/30. Memory continued to decline, however, family was not interested in starting memantine. Expectations discussed. As needed follow up advised.   Since,   03/10/2022 ALL:  Glenda Garcia returns for follow up for dementia. She was last seen 06/2020. MMSE was 14/30 at that time. We discussed starting memantine, however, she opted to continue healthy lifestyle habits. Unable to tolerate Aricept in the past due to mood changes. Her son, Mikey Bussing, and daughter in law present with her and aid in history. Since, she has continued to have cognitive decline. She is requiring more que and encouragement to perform ADLs. She is not wanting to be as active in walking or brain games as she used to be. She is sleeping fairly well but does wake in the early morning thinking it is time to get up for the day. No behavioral changes. She remains pleasantly confused most of the time. Appetite is good. She is not having difficulty mobilizing. She does not use an assistive device.   She has another son and family in her hometown but they have not been able to understand her condition. They do see her from time to time but not very active in her day to day care. Mikey Bussing and his wife are considering [facility].    07/21/2020 ALL: Glenda Garcia is a 89 y.o. female here today for follow up for memory loss. Last seen 06/2018. Aricept made mood worse. Family was advised to consider memantine versus sertraline.  She presents with her daughter who aids in history today.  Over the past 2 years, memory has seemed to progressively decline.  She has seemed to adjust well to changes in living arrangements.  She is now living with her daughter.  She has more difficulty staying on task.  She is not oriented to time.  She does continue to perform activities of  daily living independently.  May need some occasional guidance but able to complete task independently.  She exercises daily.  She likes to walk outside with her dog.  Her daughter converted their carport to an exercise room that she uses regularly.  She plays brain games and likes to put together puzzles at home.  She is sleeping well.  No difficulty eating but does have a decreased appetite.  She prefers sweets.  No difficulty with gait.  No falls.  She does not drive.  Her daughter places medications in organizer and she is able to administer independently.  She does have chronic kidney disease.  Primary care has weaned her off of some medications.  No other significant changes to medical history.   HISTORY (copied from previous note)  89 year old female here for evaluation of memory loss.   Patient was living in [facility], independently, when her PCP noted some memory loss problems.  She contacted family lives in [place] that they should, and take care of her.  Mild dementia was suspected.  Patient moved to [facility] in October 2019.  Family has noted additional short-term memory loss problems.  Patient has had significant decline in activities of daily living.  She needs assistance with shopping, transportation, bills, medications.  She is not able to function independently outside of the home.   Patient tried on donepezil but family noted increasing mood changes and lability, and therefore this medication was stopped.  REVIEW OF SYSTEMS: Out of a complete 14 system review of symptoms, the patient complains only of the following symptoms, memory loss, and all other reviewed systems are negative.   ALLERGIES: No Known Allergies   HOME MEDICATIONS: Outpatient Medications Prior to Visit  Medication Sig Dispense Refill   amLODipine (NORVASC) 5 MG tablet Take 1 tablet (5 mg total) by mouth daily. 90 tablet 1   metoprolol succinate (TOPROL-XL) 50 MG 24 hr tablet Take 1 tablet  (50 mg total) by mouth daily. 90 tablet 3   simvastatin (ZOCOR) 10 MG tablet Take 1 tablet (10 mg total) by mouth daily at 6 PM. To lower cholesterol 90 tablet 3   Vitamin D, Cholecalciferol, 25 MCG (1000 UT) CAPS Take 1,000 Units by mouth every 3 (three) days.      No facility-administered medications prior to visit.     PAST MEDICAL HISTORY: Past Medical History:  Diagnosis Date   Arthritis    CKD (chronic kidney disease), stage III (HCC)    Dementia (HCC)    Glomerulonephritis, chronic    Hyperlipidemia    Hypertension    Hypoparathyroidism (HCC)    pt. denies at preop   Memory loss    Mild intermittent asthma    Pre-diabetes    hgb a1 c 03-26-19 6.0 epic   Prediabetes    Vitamin D deficiency      PAST SURGICAL HISTORY: Past Surgical History:  Procedure Laterality Date   CYSTOSCOPY WITH RETROGRADE PYELOGRAM, URETEROSCOPY AND STENT PLACEMENT Left 05/17/2019   Procedure: CYSTOSCOPY WITH RETROGRADE PYELOGRAM, URETEROSCOPY AND STENT PLACEMENT;  Surgeon: Marcine Matar, MD;  Location[facility];  Service: Urology;  Laterality: Left;  1 HR 45 MINS   EYE SURGERY     cataract surgery   HOLMIUM LASER APPLICATION Left 05/17/2019   Procedure: HOLMIUM LASER APPLICATION;  Surgeon: Marcine Matar, MD;  Location[facility];  Service: Urology;  Laterality: Left;   PARTIAL HYSTERECTOMY     REPLACEMENT TOTAL KNEE Left      FAMILY HISTORY: Family History  Problem Relation Age of Onset   Hypertension Mother    Cancer Father    Healthy Sister    Healthy Son    Early death Son        MVA   Unexplained death Brother    Unexplained death Brother    Early death Sister        died at 47 months     SOCIAL HISTORY: Social History   Socioeconomic History   Marital status: Widowed    Spouse name: Not on file   Number of children: Not on file   Years of education: Not on file   Highest education level: Not on file  Occupational History   Not on file  Tobacco Use   Smoking  status: Former    Types: Cigarettes   Smokeless tobacco: Never   Tobacco comments:    dont remember when quit so long ago  Vaping Use   Vaping status: Never Used  Substance and Sexual Activity   Alcohol use: Not Currently   Drug use: Never   Sexual activity: Not Currently  Other Topics Concern   Not on file  Social History Narrative   Not on file   Social Drivers of Health   Financial Resource Strain: Not on file  Food Insecurity: Not on file  Transportation Needs: Not on file  Physical Activity: Not on file  Stress: Not on file  Social Connections: Not on file  Intimate Partner Violence: Not on file      PHYSICAL EXAM  There were no vitals filed for this visit.   There is no height or weight on file to calculate BMI.   Generalized: Well developed, in no acute distress  Cardiology: normal rate and rhythm, no murmur auscultated  Respiratory: clear to auscultation bilaterally    Neurological examination  Mentation: Alert, she is pleasantly confused. Follows all commands speech and language fluent Cranial nerve II-XII: Pupils were equal round reactive to light. Extraocular movements were full, visual field were full on confrontational test. Facial sensation and strength were normal. Head turning and shoulder shrug  were normal and symmetric. Motor: The motor testing reveals 5 over 5 strength of all 4 extremities. Good symmetric motor tone is noted throughout.  Sensory: Sensory testing is intact to soft touch on all 4 extremities. No evidence of extinction is noted.  Coordination: not performed Gait and station: Able to push to standing position. Stands for several seconds before taking a step. Gait is slightly arthritic.     DIAGNOSTIC DATA (LABS, IMAGING, TESTING) - I reviewed patient records, labs, notes, testing and imaging myself where available.  Lab Results  Component Value Date   WBC 9.6 05/14/2019   HGB 14.0 05/14/2019   HCT 46.1 (H) 05/14/2019   MCV  95.4 05/14/2019   PLT 223 05/14/2019      Component Value Date/Time   NA 137 05/14/2019 1359   NA 142 03/26/2019 0941   NA 138 05/16/2018 0000   K 4.7 05/14/2019 1359   K 3.8 05/16/2018 0000   CL 107 05/14/2019 1359   CL 101 05/16/2018 0000   CO2 22 05/14/2019 1359   CO2 27 05/16/2018 0000   GLUCOSE 81 05/14/2019 1359   BUN 27 (H) 05/14/2019 1359   BUN 25 03/26/2019 0941   CREATININE 1.61 (H) 05/14/2019 1359   CALCIUM 9.1 05/14/2019 1359   CALCIUM 9.0 05/16/2018 0000   PROT 7.3 03/26/2019 0941   ALBUMIN 4.2 03/26/2019 0941   ALBUMIN 4.0 12/28/2017 0000   AST 21 03/26/2019 0941   AST 16 12/28/2017 0000   ALT 14 03/26/2019 0941   ALT 12 12/28/2017 0000   ALKPHOS 72 03/26/2019 0941   ALKPHOS 73 12/28/2017 0000   BILITOT 0.3 03/26/2019 0941   GFRNONAA 29 (L) 05/14/2019 1359   GFRAA 34 (L) 05/14/2019 1359   GFRAA 27 05/16/2018 0000   Lab Results  Component Value Date   CHOL 138 03/26/2019   HDL 40 03/26/2019   LDLCALC 66 03/26/2019   TRIG 191 (H) 03/26/2019   CHOLHDL 3.5 03/26/2019   Lab Results  Component Value Date   HGBA1C 6.0 (H) 03/26/2019   No results found for: "VITAMINB12" Lab Results  Component Value Date   TSH 0.758 06/12/2018       03/10/2022    8:43 AM 07/21/2020    7:29 AM 07/24/2018   10:21 AM  MMSE - Mini Mental State Exam  Orientation to time 0 0 3  Orientation to Place 1 0 4  Registration 3 3 3   Attention/ Calculation 0 2 2  Recall 0 0 1  Language- name 2 objects 2 2 2   Language- repeat 1 1 1   Language- follow 3 step command 3 3 3   Language- read & follow direction 1 1 1   Write a sentence 0 1 1  Copy design 1 1 1   Total score 12 14 22      ASSESSMENT AND PLAN  89 y.o. year old female  has a past medical history of Arthritis, CKD (chronic kidney disease), stage III (HCC), Dementia (HCC), Glomerulonephritis, chronic, Hyperlipidemia, Hypertension, Hypoparathyroidism (HCC), Memory loss, Mild intermittent asthma, Pre-diabetes, Prediabetes,  and Vitamin D deficiency. here with   No diagnosis found.  Cyan is doing fairly well today from a physical standpoint.  Memory loss has continued to decline.  Previous MMSE 14 of 30, today it is 12 of 30.  She is not oriented to place or time.  She appears happy.  She is able to complete most ADLs independently but does need more direction and encouragement to perform tasks.  We have discussed progression of disease process.  She was unable to tolerate Aricept in the past.  We have discussed benefits and risk of Namenda as well as expectations with taking this medication. I have advised her to review this with primary care and any other members of her care team or family that she wishes. She may consider melatonin 3-5mg  daily at bedtime to see if this helps with sleep maintenance. I have encouraged her to continue memory compensation strategies, regular physical exercise, and well-balanced diet.  She may follow-up as needed.  She, her sone and daughter in law verbalized understanding and agreement with this plan.  No orders of the defined types were placed in this encounter.   I spent 35 minutes of face-to-face and non-face-to-face time with patient.  This included previsit chart review, lab review, study review, order entry, electronic health record documentation, patient education.   Shawnie Dapper, MSN, FNP-C 09/01/2023, 10:58 AM  [facility] [address] [place], [place] [address] [phone]

## 2023-09-05 ENCOUNTER — Ambulatory Visit: Admitting: Family Medicine

## 2023-10-07 ENCOUNTER — Other Ambulatory Visit: Payer: Self-pay | Admitting: Urology

## 2023-10-12 NOTE — Patient Instructions (Signed)
 SURGICAL WAITING ROOM VISITATION  Patients having surgery or a procedure may have no more than 2 support people in the waiting area - these visitors may rotate.    Children under the age of 74 must have an adult with them who is not the patient.  Due to an increase in RSV and influenza rates and associated hospitalizations, children ages 75 and under may not visit patients in [facility] hospitals.  Visitors with respiratory illnesses are discouraged from visiting and should remain at home.  If the patient needs to stay at the hospital during part of their recovery, the visitor guidelines for inpatient rooms apply. Pre-op nurse will coordinate an appropriate time for 1 support person to accompany patient in pre-op.  This support person may not rotate.    Please refer to the [facility] website for the visitor guidelines for Inpatients (after your surgery is over and you are in a regular room).       Your procedure is scheduled on:  10/27/2023    Report to [facility] Main Entrance    Report to admitting at  0730 AM   Call this number if you have problems the morning of surgery [phone]   Do not eat food  or drink liquids :After Midnight.                 If you have questions, please contact your surgeon's office.      Oral Hygiene is also important to reduce your risk of infection.                                    Remember - BRUSH YOUR TEETH THE MORNING OF SURGERY WITH YOUR REGULAR TOOTHPASTE  DENTURES WILL BE REMOVED PRIOR TO SURGERY PLEASE DO NOT APPLY "Poly grip" OR ADHESIVES!!!   Do NOT smoke after Midnight   Stop all vitamins and herbal supplements 7 days before surgery.   Take these medicines the morning of surgery with A SIP OF WATER: amlodipine, toprol   DO NOT TAKE ANY ORAL DIABETIC MEDICATIONS DAY OF YOUR SURGERY  Bring CPAP mask and tubing day of surgery.                              You may not have any metal on your body including hair pins,  jewelry, and body piercing             Do not wear make-up, lotions, powders, perfumes/cologne, or deodorant  Do not wear nail polish including gel and S&S, artificial/acrylic nails, or any other type of covering on natural nails including finger and toenails. If you have artificial nails, gel coating, etc. that needs to be removed by a nail salon please have this removed prior to surgery or surgery may need to be canceled/ delayed if the surgeon/ anesthesia feels like they are unable to be safely monitored.   Do not shave  48 hours prior to surgery.               Men may shave face and neck.   Do not bring valuables to the hospital. [facility] IS NOT             RESPONSIBLE   FOR VALUABLES.   Contacts, glasses, dentures or bridgework may not be worn into surgery.   Bring small overnight bag day of  surgery.   DO NOT BRING YOUR HOME MEDICATIONS TO THE HOSPITAL. PHARMACY WILL DISPENSE MEDICATIONS LISTED ON YOUR MEDICATION LIST TO YOU DURING YOUR ADMISSION IN THE HOSPITAL!    Patients discharged on the day of surgery will not be allowed to drive home.  Someone NEEDS to stay with you for the first 24 hours after anesthesia.   Special Instructions: Bring a copy of your healthcare power of attorney and living will documents the day of surgery if you haven't scanned them before.              Please read over the following fact sheets you were given: IF YOU HAVE QUESTIONS ABOUT YOUR PRE-OP INSTRUCTIONS PLEASE CALL [phone]   If you received a COVID test during your pre-op visit  it is requested that you wear a mask when out in public, stay away from anyone that may not be feeling well and notify your surgeon if you develop symptoms. If you test positive for Covid or have been in contact with anyone that has tested positive in the last 10 days please notify you surgeon.    [facility] - Preparing for Surgery Before surgery, you can play an important role.  Because skin is not sterile, your  skin needs to be as free of germs as possible.  You can reduce the number of germs on your skin by washing with CHG (chlorahexidine gluconate) soap before surgery.  CHG is an antiseptic cleaner which kills germs and bonds with the skin to continue killing germs even after washing. Please DO NOT use if you have an allergy to CHG or antibacterial soaps.  If your skin becomes reddened/irritated stop using the CHG and inform your nurse when you arrive at Short Stay. Do not shave (including legs and underarms) for at least 48 hours prior to the first CHG shower.  You may shave your face/neck. Please follow these instructions carefully:  1.  Shower with CHG Soap the night before surgery and the  morning of Surgery.  2.  If you choose to wash your hair, wash your hair first as usual with your  normal  shampoo.  3.  After you shampoo, rinse your hair and body thoroughly to remove the  shampoo.                           4.  Use CHG as you would any other liquid soap.  You can apply chg directly  to the skin and wash                       Gently with a scrungie or clean washcloth.  5.  Apply the CHG Soap to your body ONLY FROM THE NECK DOWN.   Do not use on face/ open                           Wound or open sores. Avoid contact with eyes, ears mouth and genitals (private parts).                       Wash face,  Genitals (private parts) with your normal soap.             6.  Wash thoroughly, paying special attention to the area where your surgery  will be performed.  7.  Thoroughly rinse your body with warm water from the neck  down.  8.  DO NOT shower/wash with your normal soap after using and rinsing off  the CHG Soap.                9.  Pat yourself dry with a clean towel.            10.  Wear clean pajamas.            11.  Place clean sheets on your bed the night of your first shower and do not  sleep with pets. Day of Surgery : Do not apply any lotions/deodorants the morning of surgery.  Please wear  clean clothes to the hospital/surgery center.  FAILURE TO FOLLOW THESE INSTRUCTIONS MAY RESULT IN THE CANCELLATION OF YOUR SURGERY PATIENT SIGNATURE_________________________________  NURSE SIGNATURE__________________________________  ________________________________________________________________________

## 2023-10-12 NOTE — Progress Notes (Addendum)
 Anesthesia Review:  PCP: DR Donna Fus at [facility]  Cardiologist : Alda Amas- LOV 2022   PPM/ ICD: Device Orders: Rep Notified:  Chest x-ray : EKG : 10/19/23  Echo : 2022  Stress test: Cardiac Cath :   Activity level: cannot  do a flight of stairs without difficulty  Sleep Study/ CPAP : none  Fasting Blood Sugar :      / Checks Blood Sugar -- times a day:    Blood Thinner/ Instructions /Last Dose: ASA / Instructions/ Last Dose :    {ST has Dementia.  Lives with daughter who is POA . Daughter to be with her DOS.  Not very verbal.  Smiles and laughs a lot.  Wears Depends,  HOH.  Currently has no hearing aids.  Uses walker.  Daughter with pt at preop appt. Daughter answered questions.  Reviewed preop instructions with daughter.    Starts on 10/27/23 with [facility] for 2 days per week.     BMP done 10/19/23 routed to DR Cathi Cluster on 10/19/23.

## 2023-10-19 ENCOUNTER — Encounter (HOSPITAL_COMMUNITY): Payer: Self-pay

## 2023-10-19 ENCOUNTER — Encounter (HOSPITAL_COMMUNITY)
Admission: RE | Admit: 2023-10-19 | Discharge: 2023-10-19 | Disposition: A | Discharge status: Home / Self Care | Source: Ambulatory Visit | Attending: Urology | Admitting: Urology

## 2023-10-19 ENCOUNTER — Other Ambulatory Visit: Payer: Self-pay

## 2023-10-19 VITALS — BP 119/59 | HR 65 | Temp 97.8°F | Resp 16 | Ht 59.0 in

## 2023-10-19 DIAGNOSIS — F0390 Unspecified dementia without behavioral disturbance: Secondary | ICD-10-CM | POA: Diagnosis not present

## 2023-10-19 DIAGNOSIS — Z87891 Personal history of nicotine dependence: Secondary | ICD-10-CM | POA: Insufficient documentation

## 2023-10-19 DIAGNOSIS — J45909 Unspecified asthma, uncomplicated: Secondary | ICD-10-CM | POA: Insufficient documentation

## 2023-10-19 DIAGNOSIS — Z01818 Encounter for other preprocedural examination: Secondary | ICD-10-CM | POA: Insufficient documentation

## 2023-10-19 DIAGNOSIS — N201 Calculus of ureter: Secondary | ICD-10-CM | POA: Diagnosis not present

## 2023-10-19 DIAGNOSIS — I129 Hypertensive chronic kidney disease with stage 1 through stage 4 chronic kidney disease, or unspecified chronic kidney disease: Secondary | ICD-10-CM | POA: Diagnosis not present

## 2023-10-19 DIAGNOSIS — R7303 Prediabetes: Secondary | ICD-10-CM | POA: Insufficient documentation

## 2023-10-19 DIAGNOSIS — N184 Chronic kidney disease, stage 4 (severe): Secondary | ICD-10-CM | POA: Insufficient documentation

## 2023-10-19 HISTORY — DX: Dyspnea, unspecified: R06.00

## 2023-10-19 HISTORY — DX: Personal history of urinary calculi: Z87.442

## 2023-10-19 LAB — BASIC METABOLIC PANEL WITH GFR
Anion gap: 12 (ref 5–15)
BUN: 28 mg/dL — ABNORMAL HIGH (ref 8–23)
CO2: 21 mmol/L — ABNORMAL LOW (ref 22–32)
Calcium: 9.2 mg/dL (ref 8.9–10.3)
Chloride: 108 mmol/L (ref 98–111)
Creatinine, Ser: 1.62 mg/dL — ABNORMAL HIGH (ref 0.44–1.00)
GFR, Estimated: 31 mL/min — ABNORMAL LOW (ref >60)
Glucose, Bld: 103 mg/dL — ABNORMAL HIGH (ref 70–99)
Potassium: 3.7 mmol/L (ref 3.5–5.1)
Sodium: 141 mmol/L (ref 135–145)

## 2023-10-19 LAB — CBC
HCT: 49.3 % — ABNORMAL HIGH (ref 36.0–46.0)
Hemoglobin: 14.6 g/dL (ref 12.0–15.0)
MCH: 29.3 pg (ref 26.0–34.0)
MCHC: 29.6 g/dL — ABNORMAL LOW (ref 30.0–36.0)
MCV: 98.8 fL (ref 80.0–100.0)
Platelets: 202 10*3/uL (ref 150–400)
RBC: 4.99 MIL/uL (ref 3.87–5.11)
RDW: 13.8 % (ref 11.5–15.5)
WBC: 11.3 10*3/uL — ABNORMAL HIGH (ref 4.0–10.5)
nRBC: 0 % (ref 0.0–0.2)

## 2023-10-20 ENCOUNTER — Encounter (HOSPITAL_COMMUNITY): Payer: Self-pay

## 2023-10-20 NOTE — Progress Notes (Signed)
 Case: [id number] Date/Time: 10/27/23 0915   Procedure: CYSTOSCOPY/URETEROSCOPY/HOLMIUM LASER/STENT PLACEMENT (Left)   Anesthesia type: General   Diagnosis: Ureteral calculus [N20.1]   Pre-op diagnosis: LEFT URETERAL STONE   [facility]   Surgeons: Thelbert Finner, MD       DISCUSSION: Glenda Garcia is an 89 yo female who presents to PAT prior to surgery above. PMH of former smoking, HTN, asthma, advanced dementia, prediabetes, CKD stage 3-4, arthritis. Daughter is POA.  Patient has previously seen Cardiology for pre op clearance in 2020 and again in 2022 for DOE. W/u included echo in 2022 which showed normal LVEF with grade I DD and no significant valvular abnormalities. She was advised to f/u in 1 year but has been lost to f/u.  Pt follows with PCP at [facility]. Last seen on 06/08/23 for urinary symptoms and gait abnormality. She has been engaging in PT regularly for muscle weakness.    VS: BP (!) 119/59   Pulse 65   Temp 36.6 C (Oral)   Resp 16   Ht 4\' 11"  (1.499 m)   SpO2 98%   BMI 33.93 kg/m   PROVIDERS: Carlye Child, MD   LABS: Labs reviewed: Acceptable for surgery. CKD stable (all labs ordered are listed, but only abnormal results are displayed)  Labs Reviewed  BASIC METABOLIC PANEL WITH GFR - Abnormal; Notable for the following components:      Result Value   CO2 21 (*)    Glucose, Bld 103 (*)    BUN 28 (*)    Creatinine, Ser 1.62 (*)    GFR, Estimated 31 (*)    All other components within normal limits  CBC - Abnormal; Notable for the following components:   WBC 11.3 (*)    HCT 49.3 (*)    MCHC 29.6 (*)    All other components within normal limits     IMAGES:   EKG 10/19/23:  NSR, rate 65  CV:  Echo 10/27/2020: IMPRESSIONS     1. Left ventricular ejection fraction, by estimation, is 60 to 65%. The  left ventricle has normal function. The left ventricle has no regional  wall motion abnormalities. Left ventricular  diastolic parameters are  consistent with Grade I diastolic  dysfunction (impaired relaxation).   2. Right ventricular systolic function is normal. The right ventricular  size is normal. There is normal pulmonary artery systolic pressure.   3. The mitral valve is normal in structure. Trivial mitral valve  regurgitation. No evidence of mitral stenosis.   4. The aortic valve is tricuspid. Aortic valve regurgitation is not  visualized. No aortic stenosis is present.   5. The inferior vena cava is normal in size with greater than 50%  respiratory variability, suggesting right atrial pressure of 3 mmHg.   Past Medical History:  Diagnosis Date   Arthritis    CKD (chronic kidney disease), stage III (HCC)    Dementia (HCC)    Dyspnea    Glomerulonephritis, chronic    History of kidney stones    Hyperlipidemia    Hypertension    Hypoparathyroidism (HCC)    pt. denies at preop   Memory loss    Pre-diabetes    hgb a1 c 03-26-19 6.0 epic   Prediabetes    Vitamin D  deficiency     Past Surgical History:  Procedure Laterality Date   CYSTOSCOPY WITH RETROGRADE PYELOGRAM, URETEROSCOPY AND STENT PLACEMENT Left 05/17/2019   Procedure: CYSTOSCOPY WITH RETROGRADE PYELOGRAM, URETEROSCOPY AND  STENT PLACEMENT;  Surgeon: Trent Frizzle, MD;  Location[facility];  Service: Urology;  Laterality: Left;  1 HR 45 MINS   EYE SURGERY     cataract surgery   HOLMIUM LASER APPLICATION Left 05/17/2019   Procedure: HOLMIUM LASER APPLICATION;  Surgeon: Trent Frizzle, MD;  Location[facility];  Service: Urology;  Laterality: Left;   PARTIAL HYSTERECTOMY     REPLACEMENT TOTAL KNEE Left     MEDICATIONS:  amLODipine  (NORVASC ) 5 MG tablet   melatonin 5 MG TABS   metoprolol  succinate (TOPROL -XL) 25 MG 24 hr tablet   metoprolol  succinate (TOPROL -XL) 50 MG 24 hr tablet   simvastatin  (ZOCOR ) 10 MG tablet   Vitamin D , Cholecalciferol, 25 MCG (1000 UT) CAPS   No current facility-administered medications for this  encounter.   Glenda Garcia [facility] Short Stay/Anesthesiology [facility] Phone [phone] 10/20/2023 11:29 AM

## 2023-10-20 NOTE — Anesthesia Preprocedure Evaluation (Addendum)
 Anesthesia Evaluation  Patient identified by MRN, date of birth, ID band Patient awake    Reviewed: Allergy & Precautions, H&P , NPO status , Patient's Chart, lab work & pertinent test results  Airway Mallampati: II  TM Distance: >3 FB Neck ROM: Full    Dental no notable dental hx. (+) Edentulous Upper, Edentulous Lower, Dental Advisory Given   Pulmonary former smoker   Pulmonary exam normal breath sounds clear to auscultation       Cardiovascular hypertension, Pt. on medications and Pt. on home beta blockers  Rhythm:Regular Rate:Normal     Neuro/Psych       Dementia negative neurological ROS     GI/Hepatic negative GI ROS, Neg liver ROS,,,  Endo/Other    Class 3 obesity  Renal/GU Renal disease  negative genitourinary   Musculoskeletal  (+) Arthritis , Osteoarthritis,    Abdominal   Peds  Hematology negative hematology ROS (+)   Anesthesia Other Findings   Reproductive/Obstetrics negative OB ROS                             Anesthesia Physical Anesthesia Plan  ASA: 3  Anesthesia Plan: General   Post-op Pain Management: Ofirmev  IV (intra-op)*   Induction: Intravenous  PONV Risk Score and Plan: 4 or greater and Ondansetron , Dexamethasone  and Treatment may vary due to age or medical condition  Airway Management Planned: LMA  Additional Equipment:   Intra-op Plan:   Post-operative Plan: Extubation in OR  Informed Consent: I have reviewed the patients History and Physical, chart, labs and discussed the procedure including the risks, benefits and alternatives for the proposed anesthesia with the patient or authorized representative who has indicated his/her understanding and acceptance.     Dental advisory given  Plan Discussed with: CRNA  Anesthesia Plan Comments: (See PAT note from 4/23)        Anesthesia Quick Evaluation

## 2023-10-21 ENCOUNTER — Other Ambulatory Visit: Payer: Self-pay | Admitting: Urology

## 2023-10-21 DIAGNOSIS — N63 Unspecified lump in unspecified breast: Secondary | ICD-10-CM

## 2023-10-26 NOTE — H&P (Signed)
 89 year old female with a history of nephrolithiasis status post URS in 2022 status post stent removal. Patient is referred herself for blood in her urine.   PMH: Diabetes, HTN, CKD creatinine previously 1.7, dementia, hypothyroidism,   Gross hematuria:  08/08/2023: patient has had blood in urine. Cysto negative for mass. CYSto showed cystitis cystica  10/05/23: MH has stons will plan for URS   LUTS: overactive bladder, urinates every 2 hours, no meds to help, sleeps often, PVR 25cc today.  10/05/23: no improvement on gemtesa.  5/1 L distal ureteral stones, plan for L URS   BReast mass on CT:  10/05/23: mammogram ordered stop vaginal estrogen   CKD:  08/08/2023: most recent creatinine is 1.7  10/05/23: Chem 7 today   Nephrolithiasis:  08/08/2023:  10/05/23: CT shows 3 distal ureteral stones at the UVJ, no GH, flank pain. UA shows no blood. Discussed options will plan for URS.   UTI: 1x 3 months ago, no breast cancer.     ALLERGIES: None    MEDICATIONS: Gemtesa 75 MG Tablet 1 tablet PO Daily  Metoprolol  Succinate  Simvastatin   Amlodipine  Besilate  Losartan  Potassium  Vitamin D3     GU PSH: Cysto Remove Stent FB Sim - 2020 Cystoscopy - 08/08/2023 Hysterectomy - about 1980 Ureteroscopic laser litho, Left - 2020     NON-GU PSH: Cataract surgery, Bilateral - 2020 Visit Complexity (formerly GPC1X) - 08/08/2023     GU PMH: Chronic kidney disease stage 2 (GFR 60-90) - 09/05/2023, - 08/08/2023 History of urolithiasis - 09/05/2023, - 08/08/2023 Gross hematuria - 08/08/2023 Postmenopausal atrophic vaginitis - 08/08/2023 Urge incontinence - 08/08/2023 Renal calculus - 2021 Ureteral calculus - 2020, - 2020, Left, This can be seen on plain film, - 2020 Hydronephrosis, Left, Secondary to large left mid ureteral calculus - 2020 Acute kidney failure    NON-GU PMH: Arthritis Diabetes Type 2 Hypercholesterolemia Hypertension Other heart failure Unspecified dementia, unspecified severity,  with anxiety    FAMILY HISTORY: 2 sons - Son father deceased - Father mother decea - Mother   SOCIAL HISTORY: Marital Status: Widowed Preferred Language: English; Ethnicity: Not Hispanic Or Latino; Race: Black or African American Current Smoking Status: Patient does not smoke anymore. Smoked for 25 years.   Tobacco Use Assessment Completed: Used Tobacco in last 30 days?    REVIEW OF SYSTEMS:    GU Review Female:   Patient denies frequent urination, hard to postpone urination, burning /pain with urination, get up at night to urinate, leakage of urine, stream starts and stops, trouble starting your stream, have to strain to urinate, and being pregnant.  Gastrointestinal (Upper):   Patient denies nausea, vomiting, and indigestion/ heartburn.  Gastrointestinal (Lower):   Patient denies diarrhea and constipation.  Constitutional:   Patient denies night sweats, fatigue, fever, and weight loss.  Skin:   Patient denies skin rash/ lesion and itching.  Eyes:   Patient denies blurred vision and double vision.  Ears/ Nose/ Throat:   Patient denies sore throat and sinus problems.  Hematologic/Lymphatic:   Patient denies swollen glands and easy bruising.  Cardiovascular:   Patient denies leg swelling and chest pains.  Respiratory:   Patient denies cough and shortness of breath.  Endocrine:   Patient denies excessive thirst.  Musculoskeletal:   Patient denies back pain and joint pain.  Neurological:   Patient denies headaches and dizziness.  Psychologic:   Patient denies depression and anxiety.   VITAL SIGNS: None   MULTI-SYSTEM PHYSICAL EXAMINATION:    Constitutional:  Well-nourished. No physical deformities. Normally developed. Good grooming.  Respiratory: No labored breathing, no use of accessory muscles.   Cardiovascular: Normal temperature, normal extremity pulses, no swelling, no varicosities.     Complexity of Data:  Source Of History:  Patient  Urine Test Review:   Urinalysis  X-Ray  Review: KUB: Reviewed Films. residual stone in L distal ureter compaed to last KUB and CT C.T. Abdomen/Pelvis: Reviewed Films. Reviewed Report. L distal ureteral stone     PROCEDURES:         KUB - 19147  A single view of the abdomen is obtained.      Patient confirmed No Neulasta OnPro Device.           Visit Complexity - G2211          Urinalysis w/Scope Dipstick Dipstick Cont'd Micro  Color: Yellow Bilirubin: Neg mg/dL WBC/hpf: 10 - 82/NFA  Appearance: Slightly Cloudy Ketones: Neg mg/dL RBC/hpf: 3 - 21/HYQ  Specific Gravity: 1.015 Blood: Trace ery/uL Bacteria: Many (>50/hpf)  pH: 6.0 Protein: Trace mg/dL Cystals: NS (Not Seen)  Glucose: Trace mg/dL Urobilinogen: 0.2 mg/dL Casts: NS (Not Seen)    Nitrites: Neg Trichomonas: Not Present    Leukocyte Esterase: 2+ leu/uL Mucous: Present      Epithelial Cells: 0 - 5/hpf      Yeast: NS (Not Seen)      Sperm: Not Present    ASSESSMENT:      ICD-10 Details  1 GU:   Ureteral calculus - N20.1 Chronic, Worsening  2   Postmenopausal atrophic vaginitis - N95.2 Chronic, Stable  3   Microscopic hematuria - R31.21    PLAN:            Medications Stop Meds: Estradiol 0.1 MG/GM Cream 1 gram Topical As Directed applyu to the vagina 2 times a week Start: 08/08/2023  Discontinue: 10/05/2023  - Reason: The patient suffered unacceptable side effects.            Orders Labs BMP  X-Rays: KUB          Schedule         Document Letter(s):  Created for Patient: Clinical Summary         Notes:   Nephrolithiasis: Patient has 3 left distal ureteral stones that have not passed in the last month still 0 today with KUB gave options of observation, ESWL and ureteroscopy due to distal stones as well as getting the stones out patient will prefer to do ureteroscopy today. No UCX but UA consistent with stone. Pt would like to proceed knowing th risks.   We discussed the risk benefits and alternatives to ureteroscopy. This includes bleeding,  infection, damage to surrounding structures including the urethra, bladder, ureter, and kidney. With these possible injuries resulting in need for intervention in the future. We discussed inability to remove all the stone and requiring follow-up ureteroscopy. We also discussed the possibility of not being able to gain access to the kidney and the need for nephrostomy tube. Possibility of long-term stent was also discussed. The patient voiced their understanding and would like to proceed.   Breast mass and vaginal atrophy: instructed to stop vaginal estrogen

## 2023-10-27 ENCOUNTER — Ambulatory Visit (HOSPITAL_COMMUNITY)
Admission: RE | Admit: 2023-10-27 | Discharge: 2023-10-27 | Disposition: A | Discharge status: Home / Self Care | Payer: Medicare (Managed Care) | Attending: Urology | Admitting: Urology

## 2023-10-27 ENCOUNTER — Encounter (HOSPITAL_COMMUNITY)
Admission: RE | Disposition: A | Discharge status: Home / Self Care | Payer: Self-pay | Source: Home / Self Care | Attending: Urology

## 2023-10-27 ENCOUNTER — Ambulatory Visit (HOSPITAL_COMMUNITY): Payer: Medicare (Managed Care) | Admitting: Medical

## 2023-10-27 ENCOUNTER — Ambulatory Visit (HOSPITAL_BASED_OUTPATIENT_CLINIC_OR_DEPARTMENT_OTHER): Payer: Medicare (Managed Care) | Admitting: Anesthesiology

## 2023-10-27 ENCOUNTER — Ambulatory Visit (HOSPITAL_COMMUNITY): Payer: Medicare (Managed Care)

## 2023-10-27 ENCOUNTER — Other Ambulatory Visit: Payer: Self-pay

## 2023-10-27 ENCOUNTER — Encounter (HOSPITAL_COMMUNITY): Payer: Self-pay | Admitting: Urology

## 2023-10-27 DIAGNOSIS — E66813 Obesity, class 3: Secondary | ICD-10-CM | POA: Insufficient documentation

## 2023-10-27 DIAGNOSIS — N189 Chronic kidney disease, unspecified: Secondary | ICD-10-CM | POA: Insufficient documentation

## 2023-10-27 DIAGNOSIS — N201 Calculus of ureter: Secondary | ICD-10-CM | POA: Diagnosis present

## 2023-10-27 DIAGNOSIS — E1122 Type 2 diabetes mellitus with diabetic chronic kidney disease: Secondary | ICD-10-CM | POA: Diagnosis not present

## 2023-10-27 DIAGNOSIS — Z6835 Body mass index (BMI) 35.0-35.9, adult: Secondary | ICD-10-CM | POA: Diagnosis not present

## 2023-10-27 DIAGNOSIS — Z01818 Encounter for other preprocedural examination: Secondary | ICD-10-CM

## 2023-10-27 DIAGNOSIS — Z79899 Other long term (current) drug therapy: Secondary | ICD-10-CM | POA: Diagnosis not present

## 2023-10-27 DIAGNOSIS — Z87891 Personal history of nicotine dependence: Secondary | ICD-10-CM

## 2023-10-27 DIAGNOSIS — F0390 Unspecified dementia without behavioral disturbance: Secondary | ICD-10-CM | POA: Diagnosis not present

## 2023-10-27 DIAGNOSIS — N952 Postmenopausal atrophic vaginitis: Secondary | ICD-10-CM | POA: Diagnosis not present

## 2023-10-27 DIAGNOSIS — I10 Essential (primary) hypertension: Secondary | ICD-10-CM

## 2023-10-27 DIAGNOSIS — M1990 Unspecified osteoarthritis, unspecified site: Secondary | ICD-10-CM | POA: Diagnosis not present

## 2023-10-27 DIAGNOSIS — I129 Hypertensive chronic kidney disease with stage 1 through stage 4 chronic kidney disease, or unspecified chronic kidney disease: Secondary | ICD-10-CM | POA: Diagnosis not present

## 2023-10-27 HISTORY — PX: CYSTOSCOPY/URETEROSCOPY/HOLMIUM LASER/STENT PLACEMENT: SHX6546

## 2023-10-27 LAB — NO BLOOD PRODUCTS

## 2023-10-27 SURGERY — CYSTOSCOPY/URETEROSCOPY/HOLMIUM LASER/STENT PLACEMENT
Anesthesia: General | Site: Ureter | Laterality: Left

## 2023-10-27 MED ORDER — FENTANYL CITRATE PF 50 MCG/ML IJ SOSY: 25.0000 ug | PREFILLED_SYRINGE | INTRAMUSCULAR | Status: DC | PRN

## 2023-10-27 MED ORDER — LIDOCAINE HCL (CARDIAC) PF 100 MG/5ML IV SOSY
PREFILLED_SYRINGE | INTRAVENOUS | Status: DC | PRN
  Administered 2023-10-27: 60 mg via INTRAVENOUS

## 2023-10-27 MED ORDER — IOHEXOL 300 MG/ML  SOLN
INTRAMUSCULAR | Status: DC | PRN
  Administered 2023-10-27: 10 mL

## 2023-10-27 MED ORDER — DEXAMETHASONE SODIUM PHOSPHATE 10 MG/ML IJ SOLN
INTRAMUSCULAR | Status: DC | PRN
  Administered 2023-10-27: 4 mg via INTRAVENOUS

## 2023-10-27 MED ORDER — LIDOCAINE HCL (PF) 2 % IJ SOLN
INTRAMUSCULAR | Status: AC
Start: 2023-10-27 — End: ?
  Filled 2023-10-27: qty 5

## 2023-10-27 MED ORDER — PROPOFOL 10 MG/ML IV BOLUS
INTRAVENOUS | Status: DC | PRN
  Administered 2023-10-27: 100 mg via INTRAVENOUS

## 2023-10-27 MED ORDER — PROPOFOL 10 MG/ML IV BOLUS
INTRAVENOUS | Status: AC
  Filled 2023-10-27: qty 20

## 2023-10-27 MED ORDER — CEFAZOLIN SODIUM-DEXTROSE 2-4 GM/100ML-% IV SOLN
2.0000 g | INTRAVENOUS | Status: AC
  Administered 2023-10-27: 2 g via INTRAVENOUS
  Filled 2023-10-27: qty 100

## 2023-10-27 MED ORDER — FENTANYL CITRATE (PF) 100 MCG/2ML IJ SOLN
INTRAMUSCULAR | Status: AC
  Filled 2023-10-27: qty 2

## 2023-10-27 MED ORDER — FENTANYL CITRATE (PF) 100 MCG/2ML IJ SOLN
INTRAMUSCULAR | Status: DC | PRN
  Administered 2023-10-27: 25 ug via INTRAVENOUS
  Administered 2023-10-27: 50 ug via INTRAVENOUS
  Administered 2023-10-27: 25 ug via INTRAVENOUS

## 2023-10-27 MED ORDER — ORAL CARE MOUTH RINSE: 15.0000 mL | Freq: Once | OROMUCOSAL | Status: AC

## 2023-10-27 MED ORDER — SULFAMETHOXAZOLE-TRIMETHOPRIM 800-160 MG PO TABS: 1.0000 | ORAL_TABLET | Freq: Two times a day (BID) | ORAL | 0 refills | Status: AC

## 2023-10-27 MED ORDER — SODIUM CHLORIDE 0.9 % IR SOLN
Status: DC | PRN
  Administered 2023-10-27 (×2): 3000 mL

## 2023-10-27 MED ORDER — HYOSCYAMINE SULFATE 0.125 MG PO TBDP: 0.1250 mg | ORAL_TABLET | Freq: Four times a day (QID) | ORAL | 0 refills | Status: DC | PRN

## 2023-10-27 MED ORDER — ONDANSETRON HCL 4 MG/2ML IJ SOLN
INTRAMUSCULAR | Status: AC
  Filled 2023-10-27: qty 2

## 2023-10-27 MED ORDER — EPHEDRINE 5 MG/ML INJ
INTRAVENOUS | Status: AC
  Filled 2023-10-27: qty 5

## 2023-10-27 MED ORDER — LACTATED RINGERS IV SOLN: INTRAVENOUS | Status: DC

## 2023-10-27 MED ORDER — CHLORHEXIDINE GLUCONATE 0.12 % MT SOLN
15.0000 mL | Freq: Once | OROMUCOSAL | Status: AC
  Administered 2023-10-27: 15 mL via OROMUCOSAL

## 2023-10-27 MED ORDER — DEXAMETHASONE SODIUM PHOSPHATE 10 MG/ML IJ SOLN
INTRAMUSCULAR | Status: AC
  Filled 2023-10-27: qty 1

## 2023-10-27 MED ORDER — EPHEDRINE SULFATE-NACL 50-0.9 MG/10ML-% IV SOSY
PREFILLED_SYRINGE | INTRAVENOUS | Status: DC | PRN
  Administered 2023-10-27 (×2): 10 mg via INTRAVENOUS

## 2023-10-27 MED ORDER — METHOCARBAMOL 750 MG PO TABS: 750.0000 mg | ORAL_TABLET | Freq: Four times a day (QID) | ORAL | 0 refills | Status: AC

## 2023-10-27 MED ORDER — TAMSULOSIN HCL 0.4 MG PO CAPS: 0.4000 mg | ORAL_CAPSULE | Freq: Every day | ORAL | 0 refills | Status: DC

## 2023-10-27 MED ORDER — ONDANSETRON HCL 4 MG/2ML IJ SOLN
INTRAMUSCULAR | Status: DC | PRN
Start: 2023-10-27 — End: 2023-10-27
  Administered 2023-10-27: 4 mg via INTRAVENOUS

## 2023-10-27 SURGICAL SUPPLY — 20 items
BAG URO CATCHER STRL LF (MISCELLANEOUS) ×1 IMPLANT
BASKET ZERO TIP NITINOL 2.4FR (BASKET) IMPLANT
CATH URETL OPEN 5X70 (CATHETERS) ×1 IMPLANT
CLOTH BEACON ORANGE TIMEOUT ST (SAFETY) ×1 IMPLANT
EXTRACTOR STONE 1.7FRX115CM (UROLOGICAL SUPPLIES) IMPLANT
FIBER LASER MOSES 200 DFL (Laser) IMPLANT
FIBER LASER MOSES 365 DFL (Laser) IMPLANT
GLOVE BIO SURGEON STRL SZ8 (GLOVE) ×1 IMPLANT
GOWN STRL REUS W/ TWL XL LVL3 (GOWN DISPOSABLE) ×1 IMPLANT
GUIDEWIRE ANG ZIPWIRE 038X150 (WIRE) IMPLANT
GUIDEWIRE STR DUAL SENSOR (WIRE) ×1 IMPLANT
KIT TURNOVER KIT A (KITS) IMPLANT
MANIFOLD NEPTUNE II (INSTRUMENTS) ×1 IMPLANT
PACK CYSTO (CUSTOM PROCEDURE TRAY) ×1 IMPLANT
SHEATH NAVIGATOR HD 11/13X36 (SHEATH) ×1 IMPLANT
SHEATH NAVIGATOR HD 12/14X28 (SHEATH) IMPLANT
SHEATH NAVIGATOR HD 12/14X36 (SHEATH) IMPLANT
STENT URET 6FRX24 CONTOUR (STENTS) IMPLANT
TUBING CONNECTING 10 (TUBING) ×1 IMPLANT
TUBING UROLOGY SET (TUBING) ×1 IMPLANT

## 2023-10-27 NOTE — Discharge Instructions (Addendum)
 DISCHARGE INSTRUCTIONS FOR Ureteroscopy and/or Ureteral Stent Placement  MEDICATIONS:  1.  Robaxin  2. Tamsulosin   3. Hyoscyamine   4. Methocarbamol    ACTIVITY:  1. No strenuous activity x 1week  2. No driving while on narcotic pain medications  3. Drink plenty of water  4. Continue to walk at home - it is normal to see blood in the urine while the stent is in place, so keep active, but don't over do it.  5. May return to work/school tomorrow or when you feel ready  6. You may experience some pain when urinating in the kidney on the side that was operated on while the stent is in place this is normal  WHAT IS NORMAL TO EXPERIENCE: It is normal to feel the urge to urinate while the stent is in place It is normal to have blood in your urine while the stent is in place  It sometimes can be normal to have pain in your kidney when you urinate   BATHING:  1. You can shower and we recommend daily showers  2. You have a string coming from your urethra: The stent string is attached to your ureteral stent. Do not pull on this until the date mentioned below   DIET: You may return to your normal diet immediately. Because of the raw surface of your bladder, alcohol, spicy foods, acid type foods and drinks with caffeine may cause irritation or frequency and should be used in moderation. To keep your urine flowing freely and to avoid constipation, drink plenty of fluids during the day ( 8-10 glasses ). Tip: Avoid cranberry juice because it is very acidic.  SIGNS/SYMPTOMS TO CALL:  Please call us  if you have a fever greater than 101.5, uncontrolled nausea/vomiting, uncontrolled pain, dizziness, unable to urinate, bloody urine with clots greater than the size of a quarter, chest pain, shortness of breath, leg swelling, leg pain, redness around wound, drainage from wound, or any other concerns or questions.   You can reach us  at [phone].   FOLLOW-UP:  1. You may remove your stent in on Monday  10/31/23. To do this go into the shower, grab hold of the tether coming from your urethra. Pull the tether consistent motion until the stent is removed from your body. The stent will be around 10 inches long with a curl on either end.  2. You you have been set up for f/u in 6-8 weeks  3. Dr. Cathi Cluster will call you with the pathology review

## 2023-10-27 NOTE — Op Note (Signed)
 Preoperative diagnosis: left ureteral calculus  Postoperative diagnosis: left ureteral calculus  Procedure:  Cystoscopy left ureteroscopy and stone removal Ureteroscopic laser lithotripsy left 90F x 24 ureteral stent placement  left retrograde pyelography with interpretation Left ureteral biopsy   Surgeon: Dr. Aimee Alf  Anesthesia: General  Complications: None  Intraoperative findings:  3 distal left ureteral stones all fragmented and removed Proximal ureteral polyp this was biopsied. No irregularities to the ureter 6 x 24 double-J stent with strings placed in the left ureter proper placement confirmed with fluoroscopy  Left retrograde pyelogram interpretation: No evidence of ureteral leakage, no strictures no filling defects.  EBL: Minimal  Specimens: left ureteral calculus Left ureteral cytology Left ureteral biopsy  Disposition of specimens: [facility] for stone analysis  Indication: Glenda Garcia is a 89 y.o.   patient with a gross hematuria left ureteral stone and associated left symptoms. After reviewing the management options for treatment, the patient elected to proceed with the above surgical procedure(s). We have discussed the potential benefits and risks of the procedure, side effects of the proposed treatment, the likelihood of the patient achieving the goals of the procedure, and any potential problems that might occur during the procedure or recuperation. Informed consent has been obtained.   Description of procedure:  The patient was taken to the operating room and general anesthesia was induced.  The patient was placed in the dorsal lithotomy position, prepped and draped in the usual sterile fashion, and preoperative antibiotics were administered. A preoperative time-out was performed.   Cystourethroscopy was performed.  The patient's urethra was examined and was normal. Pan cystoscopy was performed and the bladder  systematically examined in its entirety. There was no evidence for any bladder tumors, stones, or other mucosal pathology.    Attention then turned to the left ureteral orifice. A 0.38 sensor guidewire was then advanced up the left ureter into the renal pelvis under fluoroscopic guidance. The 4.5 Fr semirigid ureteroscope was then advanced into the ureter next to the guidewire and the calculus was identified.   The stone was then fragmented with the 200 micron holmium laser fiber on a setting of 0.5 and frequency of 5 Hz.   All stones were then removed from the ureter with an N-gage nitinol basket.  Reinspection of the ureter revealed no remaining visible stones or fragments.   Pan ureteroscopy was performed.  In the proximal ureter there is noted to be a polyp on the medial aspect of the ureter.  This was biopsied using graspers.  There was no significant bleeding noted after the biopsy.  There is no other irregular mucosa.  Retrograde pyelogram was performed this demonstrated no contrast extravasation no filling defects and a patent ureter.  Leaving the sensor wire placed the stent was then placed over the wire and placed in the left ureter under fluoroscopic guidance proper proximal curl in the bladder as well as renal pelvis was confirmed with fluoroscopy.  The bladder was then emptied and the procedure ended.  The patient appeared to tolerate the procedure well and without complications.  The patient was able to be awakened and transferred to the recovery unit in satisfactory condition.   Disposition: The tether of the stent was left on and taped to the mons pubis. instructions for removing the stent have been provided to the patient. The patient has been scheduled for followup in 6 weeks with a renal ultrasound.  Will be discharged with Bactrim  due to only a UA prior surgery.  Will call with pathology results

## 2023-10-27 NOTE — Transfer of Care (Signed)
 Immediate Anesthesia Transfer of Care Note  Patient: Glenda Garcia  Procedure(s) Performed: CYSTOSCOPY/URETEROSCOPY/HOLMIUM LASER/STENT PLACEMENT (Left: Ureter)  Patient Location: PACU  Anesthesia Type:General  Level of Consciousness: sedated  Airway & Oxygen Therapy: Patient Spontanous Breathing and Patient connected to face mask oxygen  Post-op Assessment: Report given to RN and Post -op Vital signs reviewed and stable  Post vital signs: Reviewed and stable  Last Vitals:  Vitals Value Taken Time  BP    Temp    Pulse 58 10/27/23 1102  Resp 10 10/27/23 1102  SpO2 100 % 10/27/23 1102    Last Pain:  Vitals:   10/27/23 0741  TempSrc: Oral      Patients Stated Pain Goal: 5 (10/27/23 0746)  Complications: No notable events documented.

## 2023-10-27 NOTE — Anesthesia Procedure Notes (Signed)
 Procedure Name: LMA Insertion Date/Time: 10/27/2023 9:56 AM  Performed by: Micky Albee, CRNAPre-anesthesia Checklist: Patient identified, Emergency Drugs available, Suction available, Patient being monitored and Timeout performed Patient Re-evaluated:Patient Re-evaluated prior to induction Oxygen Delivery Method: Circle system utilized Preoxygenation: Pre-oxygenation with 100% oxygen Induction Type: IV induction LMA: LMA with gastric port inserted LMA Size: 3.0 Tube type: Oral Number of attempts: 1 Placement Confirmation: ETT inserted through vocal cords under direct vision, positive ETCO2 and breath sounds checked- equal and bilateral Tube secured with: Tape Dental Injury: Teeth and Oropharynx as per pre-operative assessment

## 2023-10-27 NOTE — Anesthesia Postprocedure Evaluation (Signed)
 Anesthesia Post Note  Patient: Glenda Garcia  Procedure(s) Performed: CYSTOSCOPY/URETEROSCOPY/HOLMIUM LASER/STENT PLACEMENT, and biopsy (Left: Ureter)     Patient location during evaluation: PACU Anesthesia Type: General Level of consciousness: awake and alert Pain management: pain level controlled Vital Signs Assessment: post-procedure vital signs reviewed and stable Respiratory status: spontaneous breathing, nonlabored ventilation and respiratory function stable Cardiovascular status: blood pressure returned to baseline and stable Postop Assessment: no apparent nausea or vomiting Anesthetic complications: no  No notable events documented.  Last Vitals:  Vitals:   10/27/23 1200 10/27/23 1226  BP:    Pulse: 70   Resp: 13 15  Temp:  36.5 C  SpO2: 92%     Last Pain:  Vitals:   10/27/23 1226  TempSrc:   PainSc: 0-No pain                 Tavarus Poteete,W. EDMOND

## 2023-10-28 ENCOUNTER — Encounter (HOSPITAL_COMMUNITY): Payer: Self-pay | Admitting: Urology

## 2023-10-28 LAB — SURGICAL PATHOLOGY

## 2023-10-31 LAB — CYTOLOGY - NON PAP

## 2023-11-04 LAB — STONE ANALYSIS
Calcium Oxalate Dihydrate: 10 %
Calcium Oxalate Monohydrate: 85 %
Calcium Phosphate (Hydroxyl): 5 %
Weight Calculi: 20 mg

## 2023-12-12 ENCOUNTER — Other Ambulatory Visit: Payer: Self-pay | Admitting: Internal Medicine

## 2023-12-12 DIAGNOSIS — E038 Other specified hypothyroidism: Secondary | ICD-10-CM

## 2023-12-20 ENCOUNTER — Other Ambulatory Visit: Payer: Medicare (Managed Care)

## 2023-12-26 ENCOUNTER — Ambulatory Visit
Admission: RE | Admit: 2023-12-26 | Discharge: 2023-12-26 | Disposition: A | Discharge status: Home / Self Care | Payer: Medicare (Managed Care) | Source: Ambulatory Visit | Attending: Internal Medicine | Admitting: Internal Medicine

## 2023-12-26 DIAGNOSIS — E038 Other specified hypothyroidism: Secondary | ICD-10-CM

## 2024-01-29 NOTE — Progress Notes (Unsigned)
 Cardiology Office Note:    Date:  01/31/2024   ID:  Glenda Garcia, DOB [birth year], MRN [id number]  PCP:  Cesario Mutton, MD  Cardiologist:  None  Electrophysiologist:  None   Referring MD: Cesario Mutton, MD   Chief Complaint  Patient presents with   Shortness of Breath    History of Present Illness:    Glenda Garcia is a 89 y.o. female with a hx of CKD stage III, hypertension, hyperlipidemia, prediabetes, dementia who presents for follow-up.  She was referred by Dr. Raylene for preoperative evaluation on 04/25/2019, was planning cystoscopy, ureteroscopy, laser lithotripsy, stone extraction and stent placement.  No further cardiac work-up was recommended prior to procedure.  Echocardiogram 10/2020 showed normal biventricular function, no significant valvular disease.  Since last clinic visit, Daughter-in-law states that memory has worsened but otherwise doing well.  Patient does not complain of any chest pain or dyspnea but daughter-in-law notes that she seems more short of breath with walking.  She typically will walk in their yard 4 days/week for 30 to 40 minutes.  Has not reported any lightheadedness, syncope, lower extremity edema, or palpitations.  Did not take BP meds today, daughter-in-law reports she checks her BP at home and is typically 120s over 60s.   Past Medical History:  Diagnosis Date   Arthritis    CKD (chronic kidney disease), stage III (HCC)    Dementia (HCC)    Dyspnea    Glomerulonephritis, chronic    History of kidney stones    Hyperlipidemia    Hypertension    Hypoparathyroidism (HCC)    pt. denies at preop   Memory loss    Pre-diabetes    hgb a1 c 03-26-19 6.0 epic   Vitamin D  deficiency     Past Surgical History:  Procedure Laterality Date   CYSTOSCOPY WITH RETROGRADE PYELOGRAM, URETEROSCOPY AND STENT PLACEMENT Left 05/17/2019   Procedure: CYSTOSCOPY WITH RETROGRADE PYELOGRAM, URETEROSCOPY AND STENT PLACEMENT;  Surgeon: Matilda Senior, MD;   Location: [facility];  Service: Urology;  Laterality: Left;  1 HR 45 MINS   CYSTOSCOPY/URETEROSCOPY/HOLMIUM LASER/STENT PLACEMENT Left 10/27/2023   Procedure: CYSTOSCOPY/URETEROSCOPY/HOLMIUM LASER/STENT PLACEMENT, and biopsy;  Surgeon: Shane Steffan BROCKS, MD;  Location: [facility];  Service: Urology;  Laterality: Left;   EYE SURGERY     cataract surgery   HOLMIUM LASER APPLICATION Left 05/17/2019   Procedure: HOLMIUM LASER APPLICATION;  Surgeon: Matilda Senior, MD;  Location: [facility];  Service: Urology;  Laterality: Left;   PARTIAL HYSTERECTOMY     REPLACEMENT TOTAL KNEE Left     Current Medications: Current Meds  Medication Sig   amLODipine  (NORVASC ) 5 MG tablet Take 1 tablet (5 mg total) by mouth daily.   hyoscyamine  (ANASPAZ ) 0.125 MG TBDP disintergrating tablet Place 1 tablet (0.125 mg total) under the tongue every 6 (six) hours as needed for up to 20 doses.   melatonin 5 MG TABS Take 5 mg by mouth at bedtime.   metoprolol  succinate (TOPROL -XL) 25 MG 24 hr tablet Take 25 mg by mouth daily.   simvastatin  (ZOCOR ) 10 MG tablet Take 1 tablet (10 mg total) by mouth daily at 6 PM. To lower cholesterol   Vitamin D , Cholecalciferol, 25 MCG (1000 UT) CAPS Take 1,000 Units by mouth daily.     Allergies:   Patient has no known allergies.   Social History   Socioeconomic History   Marital status: Widowed    Spouse name: Not on file   Number of children: Not on  file   Years of education: Not on file   Highest education level: Not on file  Occupational History   Not on file  Tobacco Use   Smoking status: Former    Types: Cigarettes    Passive exposure: Never   Smokeless tobacco: Never   Tobacco comments:    dont remember when quit so long ago  Vaping Use   Vaping status: Never Used  Substance and Sexual Activity   Alcohol use: Never   Drug use: Never   Sexual activity: Not Currently  Other Topics Concern   Not on file  Social History Narrative   Not on file   Social Drivers of  Health   Financial Resource Strain: Not on file  Food Insecurity: Low Risk  (01/26/2024)   Received from [facility]   Hunger Vital Sign    Within the past 12 months, you worried that your food would run out before you got money to buy more: Never true    Within the past 12 months, the food you bought just didn't last and you didn't have money to get more. : Never true  Transportation Needs: No Transportation Needs (01/26/2024)   Received from [facility]    In the past 12 months, has lack of reliable transportation kept you from medical appointments, meetings, work or from getting things needed for daily living? : No  Physical Activity: Not on file  Stress: Not on file  Social Connections: Not on file     Family History: The patient's family history includes Cancer in her father; Early death in her sister and son; Healthy in her sister and son; Hypertension in her mother; Unexplained death in her brother and brother.  ROS:   Please see the history of present illness.    All other systems reviewed and are negative.  EKGs/Labs/Other Studies Reviewed:    The following studies were reviewed today:   EKG:  EKG is ordered today.  The ekg ordered today demonstrates NSR, rate 77, No ST/T abnormalities  Recent Labs: 10/19/2023: BUN 28; Creatinine, Ser 1.62; Hemoglobin 14.6; Platelets 202; Potassium 3.7; Sodium 141  Recent Lipid Panel    Component Value Date/Time   CHOL 138 03/26/2019 0941   CHOL 117 12/28/2017 0000   TRIG 191 (H) 03/26/2019 0941   TRIG 149 12/28/2017 0000   HDL 40 03/26/2019 0941   HDL 39 12/28/2017 0000   CHOLHDL 3.5 03/26/2019 0941   CHOLHDL 3.0 12/28/2017 0000   LDLCALC 66 03/26/2019 0941   LDLCALC 55 12/28/2017 0000    Physical Exam:    VS:  BP (!) 140/68   Pulse 76   Ht 4' 11 (1.499 m)   Wt 162 lb 14.4 oz (73.9 kg)   SpO2 97%   BMI 32.90 kg/m     Wt Readings from Last 3 Encounters:  01/31/24 162 lb 14.4 oz (73.9 kg)   10/27/23 178 lb (80.7 kg)  03/10/22 168 lb (76.2 kg)     GEN:  in no acute distress HEENT: Normal NECK: No JVD CARDIAC: RRR, no murmurs, rubs, gallops RESPIRATORY:  Clear to auscultation without rales, wheezing or rhonchi  ABDOMEN: Soft, non-tender, non-distended MUSCULOSKELETAL:  No edema; No deformity  SKIN: Warm and dry NEUROLOGIC:  Alert and oriented to person only PSYCHIATRIC:  Normal affect   ASSESSMENT:    1. Shortness of breath   2. Essential hypertension   3. Hyperlipidemia, unspecified hyperlipidemia type   4. Dementia, unspecified dementia severity,  unspecified dementia type, unspecified whether behavioral, psychotic, or mood disturbance or anxiety (HCC)     PLAN:    In order of problems listed above:  Dyspnea on exertion: Could be related to deconditioning. Echocardiogram 10/2020 showed normal biventricular function, no significant valvular disease. - Daughter-in-law notes worsening dyspnea, will update echocardiogram  Hypertension: On amlodipine  5 mg daily, Toprol -XL 25 mg daily.  Mildly elevated in clinic today but did not take her BP meds yet, and daughter-in-law reports has been under good control at home  Hyperlipidemia: On simvastatin  10 mg daily  Dementia: Oriented to person only.  Will update echocardiogram as above, but if abnormal not a good candidate for any invasive procedures  RTC in 1 year  Medication Adjustments/Labs and Tests Ordered: Current medicines are reviewed at length with the patient today.  Concerns regarding medicines are outlined above.  Orders Placed This Encounter  Procedures   ECHOCARDIOGRAM COMPLETE   No orders of the defined types were placed in this encounter.   Patient Instructions  Medication Instructions:  Continue current medication *If you need a refill on your cardiac medications before your next appointment, please call your pharmacy*  Lab Work: NONE If you have labs (blood work) drawn today and your tests are  completely normal, you will receive your results only by: MyChart Message (if you have MyChart) OR A paper copy in the mail If you have any lab test that is abnormal or we need to change your treatment, we will call you to review the results.  Testing/Procedures: ECHO  Your physician has requested that you have an echocardiogram. Echocardiography is a painless test that uses sound waves to create images of your heart. It provides your doctor with information about the size and shape of your heart and how well your heart's chambers and valves are working. This procedure takes approximately one hour. There are no restrictions for this procedure. Please do NOT wear cologne, perfume, aftershave, or lotions (deodorant is allowed). Please arrive 15 minutes prior to your appointment time.  Please note: We ask at that you not bring children with you during ultrasound (echo/ vascular) testing. Due to room size and safety concerns, children are not allowed in the ultrasound rooms during exams. Our front office staff cannot provide observation of children in our lobby area while testing is being conducted. An adult accompanying a patient to their appointment will only be allowed in the ultrasound room at the discretion of the ultrasound technician under special circumstances. We apologize for any inconvenience.   Follow-Up: At [facility], you and your health needs are our priority.  As part of our continuing mission to provide you with exceptional heart care, our providers are all part of one team.  This team includes your primary Cardiologist (physician) and Advanced Practice Providers or APPs (Physician Assistants and Nurse Practitioners) who all work together to provide you with the care you need, when you need it.  Your next appointment:   1 year(s)  Provider:   Dr. Kate  We recommend signing up for the patient portal called MyChart.  Sign up information is provided on this After  Visit Summary.  MyChart is used to connect with patients for Virtual Visits (Telemedicine).  Patients are able to view lab/test results, encounter notes, upcoming appointments, etc.  Non-urgent messages can be sent to your provider as well.   To learn more about what you can do with MyChart, go to [email or web address].   Other Instructions NONE  Signed, Lonni LITTIE Nanas, MD  01/31/2024 8:47 AM    [facility]

## 2024-01-31 ENCOUNTER — Ambulatory Visit: Attending: Cardiology | Admitting: Cardiology

## 2024-01-31 ENCOUNTER — Encounter: Payer: Self-pay | Admitting: Cardiology

## 2024-01-31 VITALS — BP 140/68 | HR 76 | Ht 59.0 in | Wt 162.9 lb

## 2024-01-31 DIAGNOSIS — E785 Hyperlipidemia, unspecified: Secondary | ICD-10-CM | POA: Diagnosis not present

## 2024-01-31 DIAGNOSIS — I10 Essential (primary) hypertension: Secondary | ICD-10-CM

## 2024-01-31 DIAGNOSIS — F0390 Unspecified dementia without behavioral disturbance: Secondary | ICD-10-CM | POA: Diagnosis not present

## 2024-01-31 DIAGNOSIS — R0602 Shortness of breath: Secondary | ICD-10-CM

## 2024-01-31 NOTE — Patient Instructions (Signed)
 Medication Instructions:  Continue current medication *If you need a refill on your cardiac medications before your next appointment, please call your pharmacy*  Lab Work: NONE If you have labs (blood work) drawn today and your tests are completely normal, you will receive your results only by: MyChart Message (if you have MyChart) OR A paper copy in the mail If you have any lab test that is abnormal or we need to change your treatment, we will call you to review the results.  Testing/Procedures: ECHO  Your physician has requested that you have an echocardiogram. Echocardiography is a painless test that uses sound waves to create images of your heart. It provides your doctor with information about the size and shape of your heart and how well your heart's chambers and valves are working. This procedure takes approximately one hour. There are no restrictions for this procedure. Please do NOT wear cologne, perfume, aftershave, or lotions (deodorant is allowed). Please arrive 15 minutes prior to your appointment time.  Please note: We ask at that you not bring children with you during ultrasound (echo/ vascular) testing. Due to room size and safety concerns, children are not allowed in the ultrasound rooms during exams. Our front office staff cannot provide observation of children in our lobby area while testing is being conducted. An adult accompanying a patient to their appointment will only be allowed in the ultrasound room at the discretion of the ultrasound technician under special circumstances. We apologize for any inconvenience.   Follow-Up: At [facility], you and your health needs are our priority.  As part of our continuing mission to provide you with exceptional heart care, our providers are all part of one team.  This team includes your primary Cardiologist (physician) and Advanced Practice Providers or APPs (Physician Assistants and Nurse Practitioners) who all work together  to provide you with the care you need, when you need it.  Your next appointment:   1 year(s)  Provider:   Dr. Kate  We recommend signing up for the patient portal called MyChart.  Sign up information is provided on this After Visit Summary.  MyChart is used to connect with patients for Virtual Visits (Telemedicine).  Patients are able to view lab/test results, encounter notes, upcoming appointments, etc.  Non-urgent messages can be sent to your provider as well.   To learn more about what you can do with MyChart, go to [email or web address].   Other Instructions NONE

## 2024-03-05 ENCOUNTER — Telehealth (HOSPITAL_COMMUNITY): Payer: Self-pay | Admitting: Cardiology

## 2024-03-05 NOTE — Telephone Encounter (Signed)
 Patients echo was cancelled for reason below:  03/05/2024 8:51 AM Ab:TPODNW, JASMIN B  Cancel Rsn: Conflict/Need to Reschedule (Caller (Cle-ester) stated patient has moved in to a Nursing Home and they will call back to reschedule test)  Order was removed from the ECHO [id number]

## 2024-03-07 ENCOUNTER — Ambulatory Visit (HOSPITAL_COMMUNITY)

## 2024-05-14 ENCOUNTER — Encounter: Payer: Self-pay | Admitting: Cardiology

## 2024-07-03 ENCOUNTER — Emergency Department (HOSPITAL_COMMUNITY)

## 2024-07-03 ENCOUNTER — Observation Stay (HOSPITAL_COMMUNITY): Admission: EM | Admit: 2024-07-03 | Discharge: 2024-07-04 | Disposition: A | Discharge status: Home / Self Care

## 2024-07-03 ENCOUNTER — Other Ambulatory Visit: Payer: Self-pay

## 2024-07-03 DIAGNOSIS — F0390 Unspecified dementia without behavioral disturbance: Secondary | ICD-10-CM | POA: Insufficient documentation

## 2024-07-03 DIAGNOSIS — R404 Transient alteration of awareness: Principal | ICD-10-CM

## 2024-07-03 DIAGNOSIS — D72829 Elevated white blood cell count, unspecified: Secondary | ICD-10-CM | POA: Insufficient documentation

## 2024-07-03 DIAGNOSIS — R4781 Slurred speech: Secondary | ICD-10-CM | POA: Diagnosis not present

## 2024-07-03 DIAGNOSIS — N390 Urinary tract infection, site not specified: Secondary | ICD-10-CM | POA: Diagnosis not present

## 2024-07-03 DIAGNOSIS — E782 Mixed hyperlipidemia: Secondary | ICD-10-CM | POA: Diagnosis not present

## 2024-07-03 DIAGNOSIS — E041 Nontoxic single thyroid nodule: Secondary | ICD-10-CM | POA: Insufficient documentation

## 2024-07-03 DIAGNOSIS — R569 Unspecified convulsions: Secondary | ICD-10-CM

## 2024-07-03 DIAGNOSIS — Z79899 Other long term (current) drug therapy: Secondary | ICD-10-CM | POA: Insufficient documentation

## 2024-07-03 DIAGNOSIS — N183 Chronic kidney disease, stage 3 unspecified: Secondary | ICD-10-CM | POA: Diagnosis not present

## 2024-07-03 DIAGNOSIS — G309 Alzheimer's disease, unspecified: Secondary | ICD-10-CM | POA: Insufficient documentation

## 2024-07-03 DIAGNOSIS — R55 Syncope and collapse: Secondary | ICD-10-CM | POA: Diagnosis not present

## 2024-07-03 DIAGNOSIS — R944 Abnormal results of kidney function studies: Secondary | ICD-10-CM | POA: Insufficient documentation

## 2024-07-03 DIAGNOSIS — I129 Hypertensive chronic kidney disease with stage 1 through stage 4 chronic kidney disease, or unspecified chronic kidney disease: Secondary | ICD-10-CM | POA: Insufficient documentation

## 2024-07-03 DIAGNOSIS — R7303 Prediabetes: Secondary | ICD-10-CM | POA: Insufficient documentation

## 2024-07-03 DIAGNOSIS — N3000 Acute cystitis without hematuria: Secondary | ICD-10-CM

## 2024-07-03 DIAGNOSIS — N184 Chronic kidney disease, stage 4 (severe): Secondary | ICD-10-CM | POA: Insufficient documentation

## 2024-07-03 DIAGNOSIS — D696 Thrombocytopenia, unspecified: Secondary | ICD-10-CM | POA: Insufficient documentation

## 2024-07-03 DIAGNOSIS — R7989 Other specified abnormal findings of blood chemistry: Secondary | ICD-10-CM | POA: Insufficient documentation

## 2024-07-03 DIAGNOSIS — R7401 Elevation of levels of liver transaminase levels: Secondary | ICD-10-CM | POA: Insufficient documentation

## 2024-07-03 LAB — COMPREHENSIVE METABOLIC PANEL WITH GFR
ALT: 53 U/L — ABNORMAL HIGH (ref 0–44)
AST: 100 U/L — ABNORMAL HIGH (ref 15–41)
Albumin: 3.8 g/dL (ref 3.5–5.0)
Alkaline Phosphatase: 84 U/L (ref 38–126)
Anion gap: 15 (ref 5–15)
BUN: 27 mg/dL — ABNORMAL HIGH (ref 8–23)
CO2: 20 mmol/L — ABNORMAL LOW (ref 22–32)
Calcium: 9 mg/dL (ref 8.9–10.3)
Chloride: 102 mmol/L (ref 98–111)
Creatinine, Ser: 1.82 mg/dL — ABNORMAL HIGH (ref 0.44–1.00)
GFR, Estimated: 26 mL/min — ABNORMAL LOW
Glucose, Bld: 172 mg/dL — ABNORMAL HIGH (ref 70–99)
Potassium: 5.1 mmol/L (ref 3.5–5.1)
Sodium: 136 mmol/L (ref 135–145)
Total Bilirubin: 0.6 mg/dL (ref 0.0–1.2)
Total Protein: 7.5 g/dL (ref 6.5–8.1)

## 2024-07-03 LAB — DIFFERENTIAL
Abs Immature Granulocytes: 0.05 K/uL (ref 0.00–0.07)
Basophils Absolute: 0.1 K/uL (ref 0.0–0.1)
Basophils Relative: 1 %
Eosinophils Absolute: 0.4 K/uL (ref 0.0–0.5)
Eosinophils Relative: 3 %
Immature Granulocytes: 0 %
Lymphocytes Relative: 27 %
Lymphs Abs: 3.7 K/uL (ref 0.7–4.0)
Monocytes Absolute: 0.7 K/uL (ref 0.1–1.0)
Monocytes Relative: 5 %
Neutro Abs: 8.7 K/uL — ABNORMAL HIGH (ref 1.7–7.7)
Neutrophils Relative %: 64 %

## 2024-07-03 LAB — URINALYSIS, ROUTINE W REFLEX MICROSCOPIC
Bilirubin Urine: NEGATIVE
Glucose, UA: NEGATIVE mg/dL
Ketones, ur: NEGATIVE mg/dL
Nitrite: NEGATIVE
Protein, ur: 30 mg/dL — AB
Specific Gravity, Urine: 1.026 (ref 1.005–1.030)
pH: 6 (ref 5.0–8.0)

## 2024-07-03 LAB — TROPONIN T, HIGH SENSITIVITY
Troponin T High Sensitivity: 235 ng/L (ref 0–19)
Troponin T High Sensitivity: 292 ng/L (ref 0–19)

## 2024-07-03 LAB — CBC
HCT: 48 % — ABNORMAL HIGH (ref 36.0–46.0)
Hemoglobin: 15.1 g/dL — ABNORMAL HIGH (ref 12.0–15.0)
MCH: 30.1 pg (ref 26.0–34.0)
MCHC: 31.5 g/dL (ref 30.0–36.0)
MCV: 95.8 fL (ref 80.0–100.0)
Platelets: 103 K/uL — ABNORMAL LOW (ref 150–400)
RBC: 5.01 MIL/uL (ref 3.87–5.11)
RDW: 13.4 % (ref 11.5–15.5)
WBC: 13.5 K/uL — ABNORMAL HIGH (ref 4.0–10.5)
nRBC: 0 % (ref 0.0–0.2)

## 2024-07-03 LAB — APTT: aPTT: 25 s (ref 24–36)

## 2024-07-03 LAB — CBG MONITORING, ED
Glucose-Capillary: 187 mg/dL — ABNORMAL HIGH (ref 70–99)
Glucose-Capillary: 103 mg/dL — ABNORMAL HIGH (ref 70–99)

## 2024-07-03 LAB — I-STAT CHEM 8, ED
BUN: 42 mg/dL — ABNORMAL HIGH (ref 8–23)
Calcium, Ion: 1 mmol/L — ABNORMAL LOW (ref 1.15–1.40)
Chloride: 105 mmol/L (ref 98–111)
Creatinine, Ser: 1.8 mg/dL — ABNORMAL HIGH (ref 0.44–1.00)
Glucose, Bld: 169 mg/dL — ABNORMAL HIGH (ref 70–99)
HCT: 49 % — ABNORMAL HIGH (ref 36.0–46.0)
Hemoglobin: 16.7 g/dL — ABNORMAL HIGH (ref 12.0–15.0)
Potassium: 5.5 mmol/L — ABNORMAL HIGH (ref 3.5–5.1)
Sodium: 138 mmol/L (ref 135–145)
TCO2: 22 mmol/L (ref 22–32)

## 2024-07-03 LAB — PROTIME-INR
INR: 1.1 (ref 0.8–1.2)
Prothrombin Time: 14.4 s (ref 11.4–15.2)

## 2024-07-03 LAB — ETHANOL: Alcohol, Ethyl (B): <15 mg/dL

## 2024-07-03 LAB — POTASSIUM: Potassium: 4.8 mmol/L (ref 3.5–5.1)

## 2024-07-03 MED ORDER — IOHEXOL 350 MG/ML SOLN
75.0000 mL | Freq: Once | INTRAVENOUS | Status: AC | PRN
  Administered 2024-07-03: 75 mL via INTRAVENOUS

## 2024-07-03 MED ORDER — SODIUM ZIRCONIUM CYCLOSILICATE 10 G PO PACK: 10.0000 g | PACK | Freq: Once | ORAL | Status: DC

## 2024-07-03 MED ORDER — SODIUM CHLORIDE 0.9 % IV SOLN
INTRAVENOUS | Status: AC
  Administered 2024-07-03: 75 mL/h via INTRAVENOUS

## 2024-07-03 MED ORDER — SODIUM CHLORIDE 0.9 % IV SOLN
1.0000 g | INTRAVENOUS | Status: DC
  Administered 2024-07-03: 1 g via INTRAVENOUS
  Filled 2024-07-03: qty 10

## 2024-07-03 NOTE — Plan of Care (Signed)
 Received a page from RN that initial troponin is 235. Reviewed initial EKG which showed normal sinus rhythm heart rate 96 there is no ST-T wave abnormality.  Patient is hemodynamically stable and denying any chest pain.  Obtaining repeat EKG. and need to follow-up with second troponin level.  Informed admitting physician Dr. Editha.   Marva Hendryx, MD [facility] 07/03/2024, 8:27 PM

## 2024-07-03 NOTE — ED Notes (Signed)
 Pt just moved from orange zone to yellow to hold for admission, report just gotten. Pt is resting on bed comfortable NAD noticed.

## 2024-07-03 NOTE — Code Documentation (Signed)
 Stroke Response Nurse Documentation Code Documentation  Glenda Garcia is a 89 y.o. female arriving to [facility]  via [facility] on 07/03/2024 with past medical hx of dementia, HTN, and HLD. On No antithrombotic. Code stroke was activated by EMS.   Patient from home, where she was LKW at 1500. She lives with her son, who is also her full-time caregiver. Her baseline is very limited, and she is able to do things for herself with a lot of supervision. Her speech at baseline is also limited, stating one word answers, often mimicking the person she is talking to. However, patient went to the restroom around 1500, where her son assisted her and left to give the patient privacy. A few moments later, her son found her lying down. She had more than usual slurred speech, and appeared to have had a syncopal episode. Son called EMS.  Stroke team at the bedside on patient arrival. Labs drawn and patient cleared for CT by Dr. Franklyn. Patient to CT with team. NIHSS 8, see documentation for details and code stroke times. Patient with disoriented, not following commands, and bilateral leg weakness on exam.  The following imaging was completed:  CT Head and CTA. Patient is not a candidate for IV Thrombolytic due to stroke not suspected. Patient is not a candidate for IR due to LVO not found.   Care Plan: STAT MRI and Q2hr NIHSS/VS. [facility] Swallow Screen when deemed safe to do so.   Process Delays Noted: None  Bedside handoff with ED RN Chiquita.    Alexiah Koroma M Addilee Neu  Stroke Response RN

## 2024-07-03 NOTE — ED Triage Notes (Signed)
 Patient arrives via [facility] activated as a code stroke prior to arrival. Patient is from home where her LKW is 1500. Patient with repetitive speech that was more slurred per family. Patient is not on thinners, hx of hypertension. Patient EKG unremarkable. Patient was sitting on toilet and had syncopal episode. Patient was unresponsive x3 minutes per family. GCS 14 at baseline. Patient uses walker.   CBG 139 BP 136/84 HR 78 RR 18 94 on room air

## 2024-07-03 NOTE — Consult Note (Signed)
 "  Cardiology Consultation   Patient ID: Glenda Garcia MRN: [id number]; DOB: 20-Apr-1934  Admit date: 07/03/2024 Date of Consult: 07/03/2024  PCP:  Cesario Mutton, MD   [facility] Providers Cardiologist:  None        Patient Profile: Glenda Garcia is a 89 y.o. female with a hx of chronic kidney disease stage III, hypertension, hyperlipidemia, prediabetes, and dementia who is being seen 07/03/2024 for the evaluation of syncope and elevated troponin at the request of the hospitalist service.  History of Present Illness: Unable to obtain a history from the patient, her responses were incoherent.  Glenda Garcia presented to the ER for unresponsiveness while on the toilet.  She was noted to have slurred speech.  A stroke code was called, however findings on CT and MRI were negative.  During her workup she was noted to have a high-sensitivity troponin of 235.  This prompted cardiology consult  Follows up with cardiology for management of hypertension..  Last seen on 01/31/2024 valuation of shortness of breath.  Last TTE in 10/2020 which showed normal biventricular function and no signs of significant valvular disease.  Patient has severe dementia.  Patient is oriented to person only.  He is deemed not a good candidate for invasive procedures.  Notable vitals include heart rate 90s in sinus rhythm, blood pressure 138/58, SpO2 97% on room air, notable labs include K5.5, serum creatinine 1.8, high-sensitivity troponin 235.  CG shows sinus rhythm without evidence of ST changes or infarct.   Past Medical History:  Diagnosis Date   Arthritis    CKD (chronic kidney disease), stage III (HCC)    Dementia (HCC)    Dyspnea    Glomerulonephritis, chronic    History of kidney stones    Hyperlipidemia    Hypertension    Hypoparathyroidism    pt. denies at preop   Memory loss    Pre-diabetes    hgb a1 c 03-26-19 6.0 epic   Vitamin D  deficiency     Past Surgical History:  Procedure  Laterality Date   CYSTOSCOPY WITH RETROGRADE PYELOGRAM, URETEROSCOPY AND STENT PLACEMENT Left 05/17/2019   Procedure: CYSTOSCOPY WITH RETROGRADE PYELOGRAM, URETEROSCOPY AND STENT PLACEMENT;  Surgeon: Matilda Senior, MD;  Location: [facility];  Service: Urology;  Laterality: Left;  1 HR 45 MINS   CYSTOSCOPY/URETEROSCOPY/HOLMIUM LASER/STENT PLACEMENT Left 10/27/2023   Procedure: CYSTOSCOPY/URETEROSCOPY/HOLMIUM LASER/STENT PLACEMENT, and biopsy;  Surgeon: Shane Steffan BROCKS, MD;  Location: [facility];  Service: Urology;  Laterality: Left;   EYE SURGERY     cataract surgery   HOLMIUM LASER APPLICATION Left 05/17/2019   Procedure: HOLMIUM LASER APPLICATION;  Surgeon: Matilda Senior, MD;  Location: [facility];  Service: Urology;  Laterality: Left;   PARTIAL HYSTERECTOMY     REPLACEMENT TOTAL KNEE Left      Home Medications:  Prior to Admission medications  Medication Sig Start Date End Date Taking? Authorizing Provider  amLODipine  (NORVASC ) 5 MG tablet Take 1 tablet (5 mg total) by mouth daily. 03/26/19   Fulp, Cammie, MD  hyoscyamine  (ANASPAZ ) 0.125 MG TBDP disintergrating tablet Place 1 tablet (0.125 mg total) under the tongue every 6 (six) hours as needed for up to 20 doses. 10/27/23   Shane Steffan BROCKS, MD  melatonin 5 MG TABS Take 5 mg by mouth at bedtime.    [provider]  metoprolol  succinate (TOPROL -XL) 25 MG 24 hr tablet Take 25 mg by mouth daily. 08/24/22   [provider]  simvastatin  (ZOCOR ) 10 MG  tablet Take 1 tablet (10 mg total) by mouth daily at 6 PM. To lower cholesterol 03/26/19   Fulp, Cammie, MD  tamsulosin  (FLOMAX ) 0.4 MG CAPS capsule Take 1 capsule (0.4 mg total) by mouth daily after supper. Patient not taking: Reported on 01/31/2024 10/27/23   Shane Steffan BROCKS, MD  Vitamin D , Cholecalciferol, 25 MCG (1000 UT) CAPS Take 1,000 Units by mouth daily.    [provider]    Scheduled Meds:  Continuous Infusions:  sodium chloride      PRN  Meds:   Allergies:   Allergies[1]  Social History:   Social History   Socioeconomic History   Marital status: Widowed    Spouse name: Not on file   Number of children: Not on file   Years of education: Not on file   Highest education level: Not on file  Occupational History   Not on file  Tobacco Use   Smoking status: Former    Types: Cigarettes    Passive exposure: Never   Smokeless tobacco: Never   Tobacco comments:    dont remember when quit so long ago  Vaping Use   Vaping status: Never Used  Substance and Sexual Activity   Alcohol use: Never   Drug use: Never   Sexual activity: Not Currently  Other Topics Concern   Not on file  Social History Narrative   Not on file   Social Drivers of Health   Tobacco Use: Medium Risk (05/29/2024)   Received from [facility]   Patient History    Smoking Tobacco Use: Former    Smokeless Tobacco Use: Never    Passive Exposure: Not on Actuary Strain: Not on file  Food Insecurity: Low Risk (01/26/2024)   Received from [facility]   Epic    Within the past 12 months, you worried that your food would run out before you got money to buy more: Never true    Within the past 12 months, the food you bought just didn't last and you didn't have money to get more. : Never true  Transportation Needs: No Transportation Needs (01/26/2024)   Received from [facility]    In the past 12 months, has lack of reliable transportation kept you from medical appointments, meetings, work or from getting things needed for daily living? : No  Physical Activity: Not on file  Stress: Not on file  Social Connections: Not on file  Intimate Partner Violence: Not on file  Depression (EYV7-0): Not on file  Alcohol Screen: Not on file  Housing: Low Risk (01/26/2024)   Received from [facility]   Epic    What is your living situation today?: I have a steady place to live    Think about the place you live. Do you have  problems with any of the following? Choose all that apply:: None/None on this list  Utilities: Low Risk (01/26/2024)   Received from [facility]   Utilities    In the past 12 months has the electric, gas, oil, or water company threatened to shut off services in your home? : No  Health Literacy: Not on file    Family History:    Family History  Problem Relation Age of Onset   Hypertension Mother    Cancer Father    Healthy Sister    Healthy Son    Early death Son        MVA   Unexplained death Brother  Unexplained death Brother    Early death Sister        died at 109 months     ROS:  Please see the history of present illness.   All other ROS reviewed and negative.     Physical Exam/Data: Vitals:   07/03/24 1644 07/03/24 1651 07/03/24 2000 07/03/24 2115  BP: (!) 138/58  121/73 127/65  Pulse: (!) 112  100 92  Resp: 20  19 19   Temp: 97.7 F (36.5 C)     TempSrc: Oral     SpO2: 94%  97% 96%  Weight:  71.2 kg    Height:  4' 11 (1.499 m)      Intake/Output Summary (Last 24 hours) at 07/03/2024 2129 Last data filed at 07/03/2024 1956 Gross per 24 hour  Intake --  Output 200 ml  Net -200 ml      07/03/2024    4:51 PM 07/03/2024    4:23 PM 01/31/2024    8:26 AM  Last 3 Weights  Weight (lbs) 156 lb 15.5 oz 156 lb 15.5 oz 162 lb 14.4 oz  Weight (kg) 71.2 kg 71.2 kg 73.891 kg     Body mass index is 31.7 kg/m.  General:  responds to questions, but is incoherent and tangiental in speech HEENT: normal Neck: no JVD Vascular: No carotid bruits; Distal pulses 2+ bilaterally Cardiac:  rrr Lungs:  clear to auscultation bilaterally, no wheezing, rhonchi or rales  Abd: soft, nontender, no hepatomegaly  Ext: no edema Musculoskeletal:  No deformities, BUE and BLE strength normal and equal Skin: warm and dry  Neuro:  moves upper and lower extremities  Psych:  Normal affect   EKG:  The EKG was personally reviewed and demonstrates: Sinus rhythm Telemetry:  Telemetry was  personally reviewed and demonstrates:  reviewed  Relevant CV Studies: reviewed  Laboratory Data: High Sensitivity Troponin:  No results for input(s): TROPONINIHS in the last 720 hours.  Recent Labs  Lab 07/03/24 1858  TRNPT 235*      Chemistry Recent Labs  Lab 07/03/24 1624 07/03/24 1625  NA 136 138  K 5.1 5.5*  CL 102 105  CO2 20*  --   GLUCOSE 172* 169*  BUN 27* 42*  CREATININE 1.82* 1.80*  CALCIUM 9.0  --   GFRNONAA 26*  --   ANIONGAP 15  --     Recent Labs  Lab 07/03/24 1624  PROT 7.5  ALBUMIN 3.8  AST 100*  ALT 53*  ALKPHOS 84  BILITOT 0.6   Lipids No results for input(s): CHOL, TRIG, HDL, LABVLDL, LDLCALC, CHOLHDL in the last 168 hours.  Hematology Recent Labs  Lab 07/03/24 1624 07/03/24 1625  WBC 13.5*  --   RBC 5.01  --   HGB 15.1* 16.7*  HCT 48.0* 49.0*  MCV 95.8  --   MCH 30.1  --   MCHC 31.5  --   RDW 13.4  --   PLT 103*  --    Thyroid  No results for input(s): TSH, FREET4 in the last 168 hours.  BNPNo results for input(s): BNP, PROBNP in the last 168 hours.  DDimer No results for input(s): DDIMER in the last 168 hours.  Radiology/Studies:  MR BRAIN WO CONTRAST Result Date: 07/03/2024 EXAM: MRI BRAIN WITHOUT CONTRAST 07/03/2024 05:49:45 PM TECHNIQUE: Multiplanar multisequence MRI of the head/brain was performed without the administration of intravenous contrast. COMPARISON: Head CT and CTA 07/03/2024 and MRI 07/31/2018. CLINICAL HISTORY: Neuro deficit, acute, stroke suspected; aphasia. FINDINGS: LIMITATIONS: The  examination is mildly motion degraded. BRAIN AND VENTRICLES: There is no evidence of an acute infarct, intracranial hemorrhage, mass, midline shift, hydrocephalus, or extra-axial fluid collection. Patchy T2 hyperintensities in the cerebral white matter and pons are nonspecific but compatible with mild to moderate chronic small vessel ischemic disease, mildly progressed from the prior MRI. Chronic infarcts are noted  in the left centrum semiovale and bilateral basal ganglia. There is moderately advanced cerebral atrophy with prominent expansion of the subarachnoid spaces over the frontal convexities. Hypoplastic left vertebral artery, more fully evaluated on today's CTA. ORBITS: Bilateral cataract extraction. SINUSES AND MASTOIDS: Small right mastoid effusion. Clear paranasal sinuses. BONES AND SOFT TISSUES: Normal marrow signal. No acute soft tissue abnormality. IMPRESSION: 1. No acute intracranial abnormality. 2. Mild to moderate chronic small vessel ischemic disease. Electronically signed by: Dasie Hamburg MD 07/03/2024 06:26 PM EST RP Workstation: [id number]   CT ANGIO HEAD NECK W WO CM (CODE STROKE) Result Date: 07/03/2024 EXAM: CTA HEAD AND NECK WITH AND WITHOUT 07/03/2024 04:33:00 PM TECHNIQUE: CTA of the head and neck was performed with and without the administration of intravenous contrast. 75 mL (iohexol  (OMNIPAQUE ) 350 MG/ML injection 75 mL IOHEXOL  350 MG/ML SOLN) was administered. Multiplanar 2D and/or 3D reformatted images are provided for review. Automated exposure control, iterative reconstruction, and/or weight based adjustment of the mA/kV was utilized to reduce the radiation dose to as low as reasonably achievable. Stenosis of the internal carotid arteries measured using NASCET criteria. COMPARISON: Thyroid  ultrasound 12/26/2023 CLINICAL HISTORY: Neuro deficit, acute, stroke suspected. FINDINGS: CTA NECK: AORTIC ARCH AND ARCH VESSELS: Irregular soft and scattered calcified plaque in the aortic arch and proximal left subclavian artery. No dissection or arterial injury. No significant stenosis of the brachiocephalic artery. CERVICAL CAROTID ARTERIES: Retropharyngeal course of the distal common and proximal internal carotid arteries. No dissection, arterial injury, or hemodynamically significant stenosis by NASCET criteria. CERVICAL VERTEBRAL ARTERIES: Strongly dominant right vertebral artery without evidence of a  significant stenosis or dissection. 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 LUNGS AND MEDIASTINUM: Severe emphysema. 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 No cervical lymphadenopathy. BONES: Moderate cervical spondylosis. CTA HEAD: ANTERIOR CIRCULATION: The intracranial internal carotid arteries are patent with mild atherosclerosis not resulting in a significant stenosis. ACAs and MCAs are patent without evidence of a proximal branch lesion or significant proximal stenosis. No aneurysm. POSTERIOR CIRCULATION: The intracranial vertebral arteries are patent to the basilar. Patent PICA and SCA origins are visualized bilaterally. The basilar artery is widely patent. There are robust posterior communicating arteries bilaterally with hypoplasia of the right P1 segment. Both PCAs are patent without evidence of a significant proximal stenosis. No aneurysm. OTHER: The dural venous sinuses are not assessed due to arterial contrast timing. The findings of no LVO was communicated to Dr. EMERSON Seals at 4:40pm on 07/03/2024 by secure text page via the [facility] messaging system. IMPRESSION: 1. No large vessel occlusion or significant stenosis in the head or neck. 2. 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 3. Emphysema. Electronically signed by: Dasie Hamburg MD 07/03/2024 04:53 PM EST RP Workstation: [id number]   CT HEAD CODE STROKE WO CONTRAST Result Date: 07/03/2024 EXAM: CT HEAD WITHOUT CONTRAST 07/03/2024 04:26:51 PM TECHNIQUE: CT of the head was performed without the  administration of intravenous contrast. Automated exposure control, iterative reconstruction, and/or weight based adjustment of the  mA/kV was utilized to reduce the radiation dose to as low as reasonably achievable. COMPARISON: MRI head 07/31/2018. CLINICAL HISTORY: Acute neurological deficit, suspected stroke. Aphasia. FINDINGS: BRAIN AND VENTRICLES: There is no evidence of an acute infarct, intracranial hemorrhage, mass, midline shift, hydrocephalus, or extra-axial fluid collection. Prominent extra-axial CSF spaces over both frontal convexities are similar to the prior MRI and attributed to subarachnoid space enlargement from moderately advanced cerebral atrophy. Cerebral white matter hypodensities are nonspecific but compatible with mild chronic small vessel ischemic disease. Calcified atherosclerosis at the skull base. ORBITS: No acute abnormality. SINUSES: Small right mastoid effusion. Minimal left sphenoid sinus mucosal thickening. SOFT TISSUES AND SKULL: No acute soft tissue abnormality. No skull fracture. [place] Stroke Program Early CT Score (ASPECTS) ----- Ganglionic (caudate, IC, lentiform nucleus, insula, M1-M3): 7 Supraganglionic (M4-M6): 3 Total: 10 These results were communicated to Dr. EMERSON Seals at 4:40 pm on 07/03/2024 by secure text page via the [facility] messaging system. IMPRESSION: 1. No acute intracranial abnormality. ASPECTS of 10. 2. Mild chronic small vessel ischemic disease. Electronically signed by: Dasie Hamburg MD 07/03/2024 04:44 PM EST RP Workstation: [id number]     Assessment and Plan:  Lorice B Masella is a 89 y.o. female with a hx of chronic kidney disease stage III, hypertension, hyperlipidemia, prediabetes, and dementia who is being seen 07/03/2024 for the evaluation of syncope and elevated troponin.  Unable to obtain history from the patient.  Patient has no ECG or telemetry features concerning for preexcitation or ventricular arrhythmias.  Her last TTE in 2022 showed a structurally normal heart.  Monitor on telemetry and obtain follow-up transthoracic echocardiogram.  Recommendations -monitor on  telemetry -TTE   Risk Assessment/Risk Scores:    TIMI Risk Score for Unstable Angina or Non-ST Elevation MI:   The patient's TIMI risk score is  , which indicates a  % risk of all cause mortality, new or recurrent myocardial infarction or need for urgent revascularization in the next 14 days.         For questions or updates, please contact [facility] Please consult [email or web address] for contact info under      Signed, Domonik Levario A Trenace Coughlin, MD  07/03/2024 9:29 PM      [1] No Known Allergies  "

## 2024-07-03 NOTE — ED Notes (Signed)
 Patient transported to MRI

## 2024-07-03 NOTE — Consult Note (Signed)
 NEUROLOGY CONSULT NOTE   Date of service: July 03, 2024 Patient Name: Glenda Garcia MRN:  [id number] DOB:  1934/02/27 Chief Complaint: CODE STROKE Requesting Provider: Franklyn Sid SAILOR, MD  History of Present Illness  Glenda Garcia is a 89 y.o. female with hx of Alzheimer's Dementia, CKD3, HTN, HLD, Vit D deficiency who was BIB EMS as a CODE STROKE due to acute onset of unresponsiveness followed by slurred speech.   At bridge, patient and EMS are accompanied by patient's caretaker who witnessed the event. He states she was using the bathroom and then began to lean back and stare off, becoming unresponsive for 2-3 minutes. After this, her speech was more slurred than usual. At her baseline, she is not able to hold a normal conversation and repeats back what people say. She uses a walker at home, needs assistance bathing and getting dressed. Does not handle home finances or cooking.   On neurology exam, patient is alert, confused, intermittently follows simple command, unable to identify objects, attempts to repeat what you say in a dysarthric voice, bilateral leg weakness. CTH negative. CTA shows no LVO.   LKW: 1500 Modified rankin score: 4-Needs assistance to walk and tend to bodily needs IV Thrombolysis: No, low NIH with stroke not suspected EVT: No, no LVO   NIHSS components Score: Comment  1a Level of Conscious 0[x]  1[]  2[]  3[]      1b LOC Questions 0[]  1[x]  2[]       1c LOC Commands 0[]  1[x]  2[]       2 Best Gaze 0[x]  1[]  2[]       3 Visual 0[x]  1[]  2[]  3[]      4 Facial Palsy 0[x]  1[]  2[]  3[]      5a Motor Arm - left 0[x]  1[]  2[]  3[]  4[]  UN[]    5b Motor Arm - Right 0[x]  1[]  2[]  3[]  4[]  UN[]    6a Motor Leg - Left 0[]  1[]  2[]  3[x]  4[]  UN[]    6b Motor Leg - Right 0[]  1[]  2[]  3[x]  4[]  UN[]    7 Limb Ataxia 0[x]  1[]  2[]  UN[x]      8 Sensory 0[x]  1[]  2[]  UN[x]      9 Best Language 0[]  1[]  2[x]  3[]     Baseline aphasia present  10 Dysarthria 0[]  1[x]  2[]  UN[]      11 Extinct. and  Inattention 0[x]  1[]  2[]       TOTAL:   11      ROS   Unable to ascertain due to AMS.   Past History   Past Medical History:  Diagnosis Date   Arthritis    CKD (chronic kidney disease), stage III (HCC)    Dementia (HCC)    Dyspnea    Glomerulonephritis, chronic    History of kidney stones    Hyperlipidemia    Hypertension    Hypoparathyroidism    pt. denies at preop   Memory loss    Pre-diabetes    hgb a1 c 03-26-19 6.0 epic   Vitamin D  deficiency     Past Surgical History:  Procedure Laterality Date   CYSTOSCOPY WITH RETROGRADE PYELOGRAM, URETEROSCOPY AND STENT PLACEMENT Left 05/17/2019   Procedure: CYSTOSCOPY WITH RETROGRADE PYELOGRAM, URETEROSCOPY AND STENT PLACEMENT;  Surgeon: Matilda Senior, MD;  Location: [facility];  Service: Urology;  Laterality: Left;  1 HR 45 MINS   CYSTOSCOPY/URETEROSCOPY/HOLMIUM LASER/STENT PLACEMENT Left 10/27/2023   Procedure: CYSTOSCOPY/URETEROSCOPY/HOLMIUM LASER/STENT PLACEMENT, and biopsy;  Surgeon: Shane Steffan BROCKS, MD;  Location: [facility];  Service: Urology;  Laterality: Left;   EYE SURGERY  cataract surgery   HOLMIUM LASER APPLICATION Left 05/17/2019   Procedure: HOLMIUM LASER APPLICATION;  Surgeon: Matilda Senior, MD;  Location: [facility];  Service: Urology;  Laterality: Left;   PARTIAL HYSTERECTOMY     REPLACEMENT TOTAL KNEE Left     Family History: Family History  Problem Relation Age of Onset   Hypertension Mother    Cancer Father    Healthy Sister    Healthy Son    Early death Son        MVA   Unexplained death Brother    Unexplained death Brother    Early death Sister        died at 40 months    Social History  reports that she has quit smoking. Her smoking use included cigarettes. She has never been exposed to tobacco smoke. She has never used smokeless tobacco. She reports that she does not drink alcohol and does not use drugs.  Allergies[1]  Medications  Current Medications[2]  Vitals   Vitals:    07-26-2024 1623 07/26/24 1644 07/26/2024 1651  BP:  (!) 138/58   Pulse:  (!) 112   Resp:  20   Temp:  97.7 F (36.5 C)   TempSrc:  Oral   SpO2:  94%   Weight: 71.2 kg  71.2 kg  Height:   4' 11 (1.499 m)    Body mass index is 31.7 kg/m.   Physical Exam   Constitutional: Appears chronically ill, no acute distress. Cardiovascular: Normal rate and regular rhythm.  Respiratory: Effort normal, non-labored breathing.   Neurologic Examination   Neuro: Mental Status: Patient is awake, alert. Unable to answer orientation questions or identify objects, she is only able to try and repeat what you say to her, which she does without being prompted.  Cranial Nerves: II: Pupils are unequal, round, and reactive to light.  Blinks to threat bilaterally.  III,IV, VI: EOMI without ptosis or diploplia.  VII: Facial movement is symmetric.  VIII: hearing is intact to voice X: Uvula elevates symmetrically. Dysarthria present.  XI: Shoulder shrug is symmetric. XII: tongue is midline without atrophy or fasciculations.  Motor: Tone is normal. Bulk is normal.  BUE: no drift. Moves spontaneously with antigravity strength BLE: moderate drift. Unable to sustain off bed.  Sensory: Withdraws in all extremities.  Cerebellar: Unable to perform.    Labs/Imaging/Neurodiagnostic studies   CBC:  Recent Labs  Lab 07-26-24 1625  HGB 16.7*  HCT 49.0*   Basic Metabolic Panel:  Lab Results  Component Value Date   NA 138 2024-07-26   K 5.5 (H) 26-Jul-2024   CO2 21 (L) 10/19/2023   GLUCOSE 169 (H) 07-26-2024   BUN 42 (H) July 26, 2024   CREATININE 1.80 (H) 2024/07/26   CALCIUM 9.2 10/19/2023   GFRNONAA 31 (L) 10/19/2023   GFRAA 34 (L) 05/14/2019   Lipid Panel:  Lab Results  Component Value Date   LDLCALC 66 03/26/2019   HgbA1c:  Lab Results  Component Value Date   HGBA1C 6.0 (H) 03/26/2019   Urine Drug Screen: No results found for: LABOPIA, COCAINSCRNUR, LABBENZ, AMPHETMU, THCU,  LABBARB  Alcohol Level No results found for: ETH INR No results found for: INR APTT No results found for: APTT AED levels: No results found for: PHENYTOIN, ZONISAMIDE, LAMOTRIGINE, LEVETIRACETA  CT Head without contrast(Personally reviewed):  No acute intracranial abnormality. ASPECTS of 10. Mild chronic small vessel ischemic disease.  CT angio Head and Neck with contrast(Personally reviewed): No large vessel occlusion or significant stenosis in  the head or neck. 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 Emphysema.  MRI Brain(Personally reviewed): pending   ASSESSMENT   Loany B Cremeans is a 89 y.o. female with hx of Alzheimer's Dementia, CKD3, HTN, HLD, Vit D deficiency who was BIB EMS as a CODE STROKE due to acute onset of unresponsiveness followed by slurred speech.   On neurology exam, patient is alert, confused, intermittently follows simple command, unable to identify objects, attempts to repeat what you say in a dysarthric voice, bilateral leg weakness. CTH negative. CTA shows no LVO. Not a TNK candidate due to improving symptoms and stroke not suspected.   Patient has multiple stroke risk factors, with history of HTN and HLD. With acute onset of symptoms, recommend obtaining MRI brain to further evaluate.   RECOMMENDATIONS   - MRI Brain  -EEG ______________________________________________________________________   Bonney Rocky JAYSON Judithe, NP [facility]   I have seen the patient and reviewed the above note.  She had an episode of decreased responsiveness that started as she was standing up from the toilet.  She did not fall.  Her eyes were open, but she did not respond to steroid with a fixed gaze.  Following this, she did seem more confused than typical, but at baseline she only repeats things.  A code stroke was activated and she was evaluated  emergently with a CT/CTA which were negative.  An MRI was also requested which is negative.  She has no previous episodes of concern, and so if this was a seizure which is very unclear, then had be a first ever seizure.  I would be hesitant to start her on antiepileptics without more definitive evidence that this was a seizure and therefore I would recommend an EEG, but if this is negative then I would favor treating this as syncope.  Neurology will follow-up results of the EEG, but if it is negative then we will be signed off.  Aisha Seals, MD [facility]   If 7pm- 7am, please page neurology on call as listed in [facility]ION.     [1] No Known Allergies [2] No current facility-administered medications for this encounter.  Current Outpatient Medications:    amLODipine  (NORVASC ) 5 MG tablet, Take 1 tablet (5 mg total) by mouth daily., Disp: 90 tablet, Rfl: 1   hyoscyamine  (ANASPAZ ) 0.125 MG TBDP disintergrating tablet, Place 1 tablet (0.125 mg total) under the tongue every 6 (six) hours as needed for up to 20 doses., Disp: 20 tablet, Rfl: 0   melatonin 5 MG TABS, Take 5 mg by mouth at bedtime., Disp: , Rfl:    metoprolol  succinate (TOPROL -XL) 25 MG 24 hr tablet, Take 25 mg by mouth daily., Disp: , Rfl:    simvastatin  (ZOCOR ) 10 MG tablet, Take 1 tablet (10 mg total) by mouth daily at 6 PM. To lower cholesterol, Disp: 90 tablet, Rfl: 3   tamsulosin  (FLOMAX ) 0.4 MG CAPS capsule, Take 1 capsule (0.4 mg total) by mouth daily after supper. (Patient not taking: Reported on 01/31/2024), Disp: 30 capsule, Rfl: 0   Vitamin D , Cholecalciferol, 25 MCG (1000 UT) CAPS, Take 1,000 Units by mouth daily., Disp: , Rfl:

## 2024-07-03 NOTE — ED Provider Notes (Signed)
 " [facility] EMERGENCY DEPARTMENT AT [facility] Provider Note   CSN: [id number] Arrival date & time: 07/03/24  1617  An emergency department physician performed an initial assessment on this suspected stroke patient at 1618.  History  Chief Complaint  Patient presents with   Code Stroke    Glenda Garcia is a 89 y.o. female with PMH as listed below who presents by EMS as a code stroke.  Patient son is at bedside and gives history.  He states that he lives with his mother.  He states that normally he has to help her up off the commode and he went in to find her on the commode.  When he tried to help her up she became sort of unresponsive and would not answer him.  This lasted several minutes.  Then when she came to he tried to get her up but she sort of leaned to the right side.  She seemed very weak and seemed like she may be weaker on the right side than the left.  He also noticed that her speech was slurred at this time.  He laid her down to the ground and called 911.  She was stroke coded by EMS and brought to the hospital.  She does have a history of dementia.  When she arrived to the hospital she was improved in her mental status.  At this point on my interview son states that patient is back to her baseline mental status.  She otherwise had been feeling well and eating well.  Had not complained of any pain anywhere.  She normally uses a walker at home and needs assistance with most ADLs.  At baseline patient is not able to hold a normal conversation and mostly just repeats back what people say.   Past Medical History:  Diagnosis Date   Arthritis    CKD (chronic kidney disease), stage III (HCC)    Dementia (HCC)    Dyspnea    Glomerulonephritis, chronic    History of kidney stones    Hyperlipidemia    Hypertension    Hypoparathyroidism    pt. denies at preop   Memory loss    Pre-diabetes    hgb a1 c 03-26-19 6.0 epic   Vitamin D  deficiency        Home  Medications Prior to Admission medications  Medication Sig Start Date End Date Taking? Authorizing Provider  amLODipine  (NORVASC ) 5 MG tablet Take 1 tablet (5 mg total) by mouth daily. 03/26/19   Fulp, Cammie, MD  hyoscyamine  (ANASPAZ ) 0.125 MG TBDP disintergrating tablet Place 1 tablet (0.125 mg total) under the tongue every 6 (six) hours as needed for up to 20 doses. 10/27/23   Shane Steffan BROCKS, MD  melatonin 5 MG TABS Take 5 mg by mouth at bedtime.    [provider]  metoprolol  succinate (TOPROL -XL) 25 MG 24 hr tablet Take 25 mg by mouth daily. 08/24/22   [provider]  simvastatin  (ZOCOR ) 10 MG tablet Take 1 tablet (10 mg total) by mouth daily at 6 PM. To lower cholesterol 03/26/19   Fulp, Cammie, MD  tamsulosin  (FLOMAX ) 0.4 MG CAPS capsule Take 1 capsule (0.4 mg total) by mouth daily after supper. Patient not taking: Reported on 01/31/2024 10/27/23   Shane Steffan BROCKS, MD  Vitamin D , Cholecalciferol, 25 MCG (1000 UT) CAPS Take 1,000 Units by mouth daily.    [provider]      Allergies    Patient has no  known allergies.    Review of Systems   Review of Systems A 10 point review of systems was performed and is negative unless otherwise reported in HPI.  Physical Exam Updated Vital Signs BP (!) 138/58 (BP Location: Left Arm)   Pulse (!) 112   Temp 97.7 F (36.5 C) (Oral)   Resp 20   Ht 4' 11 (1.499 m)   Wt 71.2 kg   SpO2 94%   BMI 31.70 kg/m  Physical Exam General: Normal appearing elderly female, lying in bed.  HEENT: NCAT, PERRLA, EOMI, Sclera anicteric, MMM, trachea midline.  Cardiology: RRR, no murmurs/rubs/gallops.  Resp: Normal respiratory rate and effort. CTAB, no wheezes, rhonchi, crackles.  Abd: Soft, non-tender, non-distended. No rebound tenderness or guarding.  GU: Deferred. MSK: No peripheral edema or signs of trauma. Extremities without deformity or TTP. No cyanosis or clubbing. Skin: warm, dry.  Neuro: A&Ox1, baseline for her, CNs  II-XII grossly intact. 5/5 strength all extremities, seems globally deconditioned. Sensation grossly intact.   ED Results / Procedures / Treatments   Labs (all labs ordered are listed, but only abnormal results are displayed) Labs Reviewed  CBC - Abnormal; Notable for the following components:      Result Value   WBC 13.5 (*)    Hemoglobin 15.1 (*)    HCT 48.0 (*)    Platelets 103 (*)    All other components within normal limits  DIFFERENTIAL - Abnormal; Notable for the following components:   Neutro Abs 8.7 (*)    All other components within normal limits  COMPREHENSIVE METABOLIC PANEL WITH GFR - Abnormal; Notable for the following components:   CO2 20 (*)    Glucose, Bld 172 (*)    BUN 27 (*)    Creatinine, Ser 1.82 (*)    AST 100 (*)    ALT 53 (*)    GFR, Estimated 26 (*)    All other components within normal limits  I-STAT CHEM 8, ED - Abnormal; Notable for the following components:   Potassium 5.5 (*)    BUN 42 (*)    Creatinine, Ser 1.80 (*)    Glucose, Bld 169 (*)    Calcium, Ion 1.00 (*)    Hemoglobin 16.7 (*)    HCT 49.0 (*)    All other components within normal limits  CBG MONITORING, ED - Abnormal; Notable for the following components:   Glucose-Capillary 187 (*)    All other components within normal limits  PROTIME-INR  APTT  ETHANOL  URINALYSIS, ROUTINE W REFLEX MICROSCOPIC  TROPONIN T, HIGH SENSITIVITY    EKG EKG Interpretation Date/Time:  Tuesday July 03 2024 19:03:22 EST Ventricular Rate:  99 PR Interval:  161 QRS Duration:  82 QT Interval:  355 QTC Calculation: 456 R Axis:   9  Text Interpretation: Sinus rhythm Confirmed by Bernard Drivers ([id number]) on 07/04/2024 3:26:00 PM  Radiology MR BRAIN WO CONTRAST Result Date: 07/03/2024 EXAM: MRI BRAIN WITHOUT CONTRAST 07/03/2024 05:49:45 PM TECHNIQUE: Multiplanar multisequence MRI of the head/brain was performed without the administration of intravenous contrast. COMPARISON: Head CT and CTA 07/03/2024  and MRI 07/31/2018. CLINICAL HISTORY: Neuro deficit, acute, stroke suspected; aphasia. FINDINGS: LIMITATIONS: The examination is mildly motion degraded. BRAIN AND VENTRICLES: There is no evidence of an acute infarct, intracranial hemorrhage, mass, midline shift, hydrocephalus, or extra-axial fluid collection. Patchy T2 hyperintensities in the cerebral white matter and pons are nonspecific but compatible with mild to moderate chronic small vessel ischemic disease, mildly progressed from the prior  MRI. Chronic infarcts are noted in the left centrum semiovale and bilateral basal ganglia. There is moderately advanced cerebral atrophy with prominent expansion of the subarachnoid spaces over the frontal convexities. Hypoplastic left vertebral artery, more fully evaluated on today's CTA. ORBITS: Bilateral cataract extraction. SINUSES AND MASTOIDS: Small right mastoid effusion. Clear paranasal sinuses. BONES AND SOFT TISSUES: Normal marrow signal. No acute soft tissue abnormality. IMPRESSION: 1. No acute intracranial abnormality. 2. Mild to moderate chronic small vessel ischemic disease. Electronically signed by: Dasie Hamburg MD 07/03/2024 06:26 PM EST RP Workstation: [id number]   CT ANGIO HEAD NECK W WO CM (CODE STROKE) Result Date: 07/03/2024 EXAM: CTA HEAD AND NECK WITH AND WITHOUT 07/03/2024 04:33:00 PM TECHNIQUE: CTA of the head and neck was performed with and without the administration of intravenous contrast. 75 mL (iohexol  (OMNIPAQUE ) 350 MG/ML injection 75 mL IOHEXOL  350 MG/ML SOLN) was administered. Multiplanar 2D and/or 3D reformatted images are provided for review. Automated exposure control, iterative reconstruction, and/or weight based adjustment of the mA/kV was utilized to reduce the radiation dose to as low as reasonably achievable. Stenosis of the internal carotid arteries measured using NASCET criteria. COMPARISON: Thyroid  ultrasound 12/26/2023 CLINICAL HISTORY: Neuro deficit, acute, stroke suspected.  FINDINGS: CTA NECK: AORTIC ARCH AND ARCH VESSELS: Irregular soft and scattered calcified plaque in the aortic arch and proximal left subclavian artery. No dissection or arterial injury. No significant stenosis of the brachiocephalic artery. CERVICAL CAROTID ARTERIES: Retropharyngeal course of the distal common and proximal internal carotid arteries. No dissection, arterial injury, or hemodynamically significant stenosis by NASCET criteria. CERVICAL VERTEBRAL ARTERIES: Strongly dominant right vertebral artery without evidence of a significant stenosis or dissection. 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 LUNGS AND MEDIASTINUM: Severe emphysema. 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 No cervical lymphadenopathy. BONES: Moderate cervical spondylosis. CTA HEAD: ANTERIOR CIRCULATION: The intracranial internal carotid arteries are patent with mild atherosclerosis not resulting in a significant stenosis. ACAs and MCAs are patent without evidence of a proximal branch lesion or significant proximal stenosis. No aneurysm. POSTERIOR CIRCULATION: The intracranial vertebral arteries are patent to the basilar. Patent PICA and SCA origins are visualized bilaterally. The basilar artery is widely patent. There are robust posterior communicating arteries bilaterally with hypoplasia of the right P1 segment. Both PCAs are patent without evidence of a significant proximal stenosis. No aneurysm. OTHER: The dural venous sinuses are not assessed due to arterial contrast timing. The findings of no LVO was communicated to Dr. EMERSON Seals at 4:40pm on 07/03/2024 by secure text page via the [facility] messaging system. IMPRESSION: 1. No large vessel occlusion or significant stenosis in the head or neck. 2.  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 3. Emphysema. Electronically signed by: Dasie Hamburg MD 07/03/2024 04:53 PM EST RP Workstation: [id number]   CT HEAD CODE STROKE WO CONTRAST Result Date: 07/03/2024 EXAM: CT HEAD WITHOUT CONTRAST 07/03/2024 04:26:51 PM TECHNIQUE: CT of the head was performed without the administration of intravenous contrast. Automated exposure control, iterative reconstruction, and/or weight based adjustment of the mA/kV was utilized to reduce the radiation dose to as low as reasonably achievable. COMPARISON: MRI head 07/31/2018. CLINICAL HISTORY: Acute neurological deficit, suspected stroke. Aphasia. FINDINGS: BRAIN AND VENTRICLES: There is no evidence of an acute infarct, intracranial  hemorrhage, mass, midline shift, hydrocephalus, or extra-axial fluid collection. Prominent extra-axial CSF spaces over both frontal convexities are similar to the prior MRI and attributed to subarachnoid space enlargement from moderately advanced cerebral atrophy. Cerebral white matter hypodensities are nonspecific but compatible with mild chronic small vessel ischemic disease. Calcified atherosclerosis at the skull base. ORBITS: No acute abnormality. SINUSES: Small right mastoid effusion. Minimal left sphenoid sinus mucosal thickening. SOFT TISSUES AND SKULL: No acute soft tissue abnormality. No skull fracture. [place] Stroke Program Early CT Score (ASPECTS) ----- Ganglionic (caudate, IC, lentiform nucleus, insula, M1-M3): 7 Supraganglionic (M4-M6): 3 Total: 10 These results were communicated to Dr. EMERSON Seals at 4:40 pm on 07/03/2024 by secure text page via the [facility] messaging system. IMPRESSION: 1. No acute intracranial abnormality. ASPECTS of 10. 2. Mild chronic small vessel ischemic disease. Electronically signed by: Dasie Hamburg MD 07/03/2024 04:44 PM EST RP Workstation: [id number]    Procedures .Critical  Care  Performed by: Franklyn Sid SAILOR, MD Authorized by: Franklyn Sid SAILOR, MD   Critical care provider statement:    Critical care time (minutes):  30   Critical care was necessary to treat or prevent imminent or life-threatening deterioration of the following conditions:  Circulatory failure and CNS failure or compromise   Critical care was time spent personally by me on the following activities:  Development of treatment plan with patient or surrogate, discussions with consultants, evaluation of patient's response to treatment, examination of patient, ordering and review of laboratory studies, ordering and review of radiographic studies, ordering and performing treatments and interventions, pulse oximetry, re-evaluation of patient's condition, review of old charts and obtaining history from patient or surrogate   Care discussed with: admitting provider       Medications Ordered in ED Medications  0.9 %  sodium chloride  infusion (0 mLs Intravenous Stopped 07/04/24 1143)  iohexol  (OMNIPAQUE ) 350 MG/ML injection 75 mL (75 mLs Intravenous Contrast Given 07/03/24 1630)    ED Course/ Medical Decision Making/ A&P                          Medical Decision Making Amount and/or Complexity of Data Reviewed Labs: ordered. Decision-making details documented in ED Course. Radiology: ordered. Decision-making details documented in ED Course.  Risk Decision regarding hospitalization.    This patient presents to the ED for concern of episode of unresponsiveness, this involves an extensive number of treatment options, and is a complaint that carries with it a high risk of complications and morbidity.  I considered the following differential and admission for this acute, potentially life threatening condition.   MDM:    Patient is an elderly female who lives with her son noted an episode of unresponsiveness and possible right sided weakness with slurred speech.  She was stroke coded by EMS and on arrival  was evaluated by neurology.  She had no LVO on CTA, no ICH on CT head.  Rapid MRI brain without contrast did not demonstrate any signs of acute CVA.  Her clinical episode seems more like syncope than stroke or seizure.  Discussed with neurology who agreed and highly doubts TIA.  Neurology did recommend an inpatient EEG.  The remainder of her workup demonstrates a leukocytosis 13.5, mild hyperglycemia 172, creatinine 1.8 her EKG shows no sign of arrhythmia or ischemia, however her troponin is elevated greater than 200.  She does not complain of any chest pain or shortness of breath, per son had not complained of any of  these things, consider possible NSTEMI.  Cardiology is consulted and awaiting recommendations including repeat troponin.  Her UA is still pending.  She is afebrile and normotensive, low concern for sepsis.  Will need admission to the hospital for workup for possible NSTEMI, cardiac monitoring, inpatient EEG.  Neurology and cardiology following.  Clinical Course as of 07/05/24 0935  Tue Jul 03, 2024  1725 CT ANGIO HEAD NECK W WO CM (CODE STROKE) 1. No large vessel occlusion or significant stenosis in the head or neck. 2. 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 3. Emphysema.   [HN]  1725 CT HEAD CODE STROKE WO CONTRAST 1. No acute intracranial abnormality. ASPECTS of 10. 2. Mild chronic small vessel ischemic disease.   [HN]  1837 WBC(!): 13.5 +leukocytosis [HN]  1837 MR BRAIN WO CONTRAST 1. No acute intracranial abnormality. 2. Mild to moderate chronic small vessel ischemic disease.   [HN]  1923 D/w Dr. Michaela w/ neuro. No stroke on MRI. Highly doubts TIA.   After discussion with son, likely syncopal episode. Will need admission for cardiac monitoring. UA pending still. D/w nurse about cath. [HN]    Clinical Course User Index [HN] Franklyn Sid SAILOR, MD    Labs: I Ordered, and  personally interpreted labs.  The pertinent results include: Those listed above  Imaging Studies ordered: I ordered imaging studies including CT head, CTA, MRI brain without contrast I independently visualized and interpreted imaging. I agree with the radiologist interpretation  Additional history obtained from chart review, son at bedside, EMS.   Cardiac Monitoring: The patient was maintained on a cardiac monitor.  I personally viewed and interpreted the cardiac monitored which showed an underlying rhythm of: Normal sinus rhythm  Reevaluation: After the interventions noted above, I reevaluated the patient and found that they have :improved  Social Determinants of Health:  lives with son  Disposition: Admit to hospitalist  Co morbidities that complicate the patient evaluation  Past Medical History:  Diagnosis Date   Arthritis    CKD (chronic kidney disease), stage III (HCC)    Dementia (HCC)    Dyspnea    Glomerulonephritis, chronic    History of kidney stones    Hyperlipidemia    Hypertension    Hypoparathyroidism    pt. denies at preop   Memory loss    Pre-diabetes    hgb a1 c 03-26-19 6.0 epic   Vitamin D  deficiency      Medicines Meds ordered this encounter  Medications   iohexol  (OMNIPAQUE ) 350 MG/ML injection 75 mL    I have reviewed the patients home medicines and have made adjustments as needed  Problem List / ED Course: Problem List Items Addressed This Visit       Genitourinary   UTI (urinary tract infection)   Relevant Medications   cephALEXin  (KEFLEX ) 250 MG/5ML suspension     Other   Elevated troponin   Other Visit Diagnoses       Episode of unresponsiveness    -  Primary                   This note was created using dictation software, which may contain spelling or grammatical errors.    Franklyn Sid SAILOR, MD 07/05/24 [phone]  "

## 2024-07-03 NOTE — H&P (Addendum)
 " History and Physical    Glenda Garcia FMW:[id number] DOB: 03-15-1934 DOA: 07/03/2024  PCP: Cesario Mutton, MD  Patient coming from: Home  Chief Complaint: Unresponsiveness  HPI: Glenda Garcia is a 89 y.o. female with medical history significant of Alzheimer's dementia, glomerulonephritis, nephrolithiasis, CKD stage IV, hypertension, hyperlipidemia, prediabetes, vitamin D  deficiency presented to the ED via EMS as code stroke due to acute onset unresponsiveness followed by slurred speech.  Caregiver reported that as patient was on the commode in the bathroom, she began to lean back and stare off becoming unresponsive for 2 or 3 minutes.  After this, her speech was more slurred.  At her baseline, patient is not able to hold a normal conversation and repeats back what people say.  She uses a walker at home and needs assistance with most ADLs.  In the ED, patient was tachycardic on arrival but heart rate subsequently improved and remainder of vital signs stable.  Labs notable for WBC count 13.5, hemoglobin 15.1, platelet count 103k, potassium 5.1> 5.5, bicarb 20, anion gap 15, glucose 172, BUN 27, creatinine 1.8 (close to baseline), AST 100, ALT 53, alk phos and T. bili normal, ethanol level <15, troponin 235.  EKG showing normal sinus rhythm and no acute ischemic changes.  CT head showing no acute intracranial abnormality.  CTA head and neck negative for LVO.  Brain MRI showing no acute intracranial abnormality.  Neurology concerned about possible seizure and recommended EEG.  Neurology holding off starting antiepileptics until EEG is done.  [facility] called to admit.  Patient is currently awake and alert, resting comfortably.  She is able to say hi but otherwise not able to give any history.  Does not follow commands.  Moving bilateral upper extremities spontaneously.  Review of Systems:  Review of Systems  Reason unable to perform ROS: Dementia.    Past Medical History:  Diagnosis Date    Arthritis    CKD (chronic kidney disease), stage III (HCC)    Dementia (HCC)    Dyspnea    Glomerulonephritis, chronic    History of kidney stones    Hyperlipidemia    Hypertension    Hypoparathyroidism    pt. denies at preop   Memory loss    Pre-diabetes    hgb a1 c 03-26-19 6.0 epic   Vitamin D  deficiency     Past Surgical History:  Procedure Laterality Date   CYSTOSCOPY WITH RETROGRADE PYELOGRAM, URETEROSCOPY AND STENT PLACEMENT Left 05/17/2019   Procedure: CYSTOSCOPY WITH RETROGRADE PYELOGRAM, URETEROSCOPY AND STENT PLACEMENT;  Surgeon: Matilda Senior, MD;  Location: [facility];  Service: Urology;  Laterality: Left;  1 HR 45 MINS   CYSTOSCOPY/URETEROSCOPY/HOLMIUM LASER/STENT PLACEMENT Left 10/27/2023   Procedure: CYSTOSCOPY/URETEROSCOPY/HOLMIUM LASER/STENT PLACEMENT, and biopsy;  Surgeon: Shane Steffan BROCKS, MD;  Location: [facility];  Service: Urology;  Laterality: Left;   EYE SURGERY     cataract surgery   HOLMIUM LASER APPLICATION Left 05/17/2019   Procedure: HOLMIUM LASER APPLICATION;  Surgeon: Matilda Senior, MD;  Location: [facility];  Service: Urology;  Laterality: Left;   PARTIAL HYSTERECTOMY     REPLACEMENT TOTAL KNEE Left      reports that she has quit smoking. Her smoking use included cigarettes. She has never been exposed to tobacco smoke. She has never used smokeless tobacco. She reports that she does not drink alcohol and does not use drugs.  Allergies[1]  Family History  Problem Relation Age of Onset   Hypertension Mother    Cancer Father  Healthy Sister    Healthy Son    Early death Son        MVA   Unexplained death Brother    Unexplained death Brother    Early death Sister        died at 71 months    Prior to Admission medications  Medication Sig Start Date End Date Taking? Authorizing Provider  amLODipine  (NORVASC ) 5 MG tablet Take 1 tablet (5 mg total) by mouth daily. 03/26/19   Fulp, Cammie, MD  hyoscyamine  (ANASPAZ ) 0.125 MG TBDP disintergrating  tablet Place 1 tablet (0.125 mg total) under the tongue every 6 (six) hours as needed for up to 20 doses. 10/27/23   Shane Steffan BROCKS, MD  melatonin 5 MG TABS Take 5 mg by mouth at bedtime.    [provider]  metoprolol  succinate (TOPROL -XL) 25 MG 24 hr tablet Take 25 mg by mouth daily. 08/24/22   [provider]  simvastatin  (ZOCOR ) 10 MG tablet Take 1 tablet (10 mg total) by mouth daily at 6 PM. To lower cholesterol 03/26/19   Fulp, Cammie, MD  tamsulosin  (FLOMAX ) 0.4 MG CAPS capsule Take 1 capsule (0.4 mg total) by mouth daily after supper. Patient not taking: Reported on 01/31/2024 10/27/23   Shane Steffan BROCKS, MD  Vitamin D , Cholecalciferol, 25 MCG (1000 UT) CAPS Take 1,000 Units by mouth daily.    [provider]    Physical Exam: Vitals:   07/03/24 1623 07/03/24 1644 07/03/24 1651 07/03/24 2000  BP:  (!) 138/58  (!) 141/75  Pulse:  (!) 112  (!) 113  Resp:  20  20  Temp:  97.7 F (36.5 C)    TempSrc:  Oral    SpO2:  94%  94%  Weight: 71.2 kg  71.2 kg   Height:   4' 11 (1.499 m)     Physical Exam Vitals reviewed.  Constitutional:      General: She is not in acute distress. HENT:     Head: Normocephalic and atraumatic.  Eyes:     Extraocular Movements: Extraocular movements intact.  Cardiovascular:     Rate and Rhythm: Normal rate and regular rhythm.     Pulses: Normal pulses.  Pulmonary:     Effort: Pulmonary effort is normal. No respiratory distress.     Breath sounds: Normal breath sounds.  Abdominal:     General: Bowel sounds are normal.     Palpations: Abdomen is soft.     Tenderness: There is no abdominal tenderness. There is no guarding.  Musculoskeletal:     Right lower leg: No edema.     Left lower leg: No edema.  Skin:    General: Skin is warm and dry.  Neurological:     Mental Status: She is alert.     Comments: Does not follow commands Moving bilateral upper extremities spontaneously.     Labs on Admission: I have  personally reviewed following labs and imaging studies  CBC: Recent Labs  Lab 07/03/24 1624 07/03/24 1625  WBC 13.5*  --   NEUTROABS 8.7*  --   HGB 15.1* 16.7*  HCT 48.0* 49.0*  MCV 95.8  --   PLT 103*  --    Basic Metabolic Panel: Recent Labs  Lab 07/03/24 1624 07/03/24 1625  NA 136 138  K 5.1 5.5*  CL 102 105  CO2 20*  --   GLUCOSE 172* 169*  BUN 27* 42*  CREATININE 1.82* 1.80*  CALCIUM 9.0  --  GFR: Estimated Creatinine Clearance: 18.6 mL/min (A) (by C-G formula based on SCr of 1.8 mg/dL (H)). Liver Function Tests: Recent Labs  Lab 07/03/24 1624  AST 100*  ALT 53*  ALKPHOS 84  BILITOT 0.6  PROT 7.5  ALBUMIN 3.8   No results for input(s): LIPASE, AMYLASE in the last 168 hours. No results for input(s): AMMONIA in the last 168 hours. Coagulation Profile: Recent Labs  Lab 07/03/24 1624  INR 1.1   Cardiac Enzymes: No results for input(s): CKTOTAL, CKMB, CKMBINDEX, TROPONINI in the last 168 hours. BNP (last 3 results) No results for input(s): PROBNP in the last 8760 hours. HbA1C: No results for input(s): HGBA1C in the last 72 hours. CBG: Recent Labs  Lab 07/03/24 1620  GLUCAP 187*   Lipid Profile: No results for input(s): CHOL, HDL, LDLCALC, TRIG, CHOLHDL, LDLDIRECT in the last 72 hours. Thyroid  Function Tests: No results for input(s): TSH, T4TOTAL, FREET4, T3FREE, THYROIDAB in the last 72 hours. Anemia Panel: No results for input(s): VITAMINB12, FOLATE, FERRITIN, TIBC, IRON, RETICCTPCT in the last 72 hours. Urine analysis:    Component Value Date/Time   BILIRUBINUR negative 03/26/2019 0948   KETONESUR negative 03/26/2019 0948   PROTEINUR 7.1 12/28/2017 0000   UROBILINOGEN 0.2 03/26/2019 0948   NITRITE Negative 03/26/2019 0948   LEUKOCYTESUR Trace (A) 03/26/2019 0948    Radiological Exams on Admission: MR BRAIN WO CONTRAST Result Date: 07/03/2024 EXAM: MRI BRAIN WITHOUT CONTRAST  07/03/2024 05:49:45 PM TECHNIQUE: Multiplanar multisequence MRI of the head/brain was performed without the administration of intravenous contrast. COMPARISON: Head CT and CTA 07/03/2024 and MRI 07/31/2018. CLINICAL HISTORY: Neuro deficit, acute, stroke suspected; aphasia. FINDINGS: LIMITATIONS: The examination is mildly motion degraded. BRAIN AND VENTRICLES: There is no evidence of an acute infarct, intracranial hemorrhage, mass, midline shift, hydrocephalus, or extra-axial fluid collection. Patchy T2 hyperintensities in the cerebral white matter and pons are nonspecific but compatible with mild to moderate chronic small vessel ischemic disease, mildly progressed from the prior MRI. Chronic infarcts are noted in the left centrum semiovale and bilateral basal ganglia. There is moderately advanced cerebral atrophy with prominent expansion of the subarachnoid spaces over the frontal convexities. Hypoplastic left vertebral artery, more fully evaluated on today's CTA. ORBITS: Bilateral cataract extraction. SINUSES AND MASTOIDS: Small right mastoid effusion. Clear paranasal sinuses. BONES AND SOFT TISSUES: Normal marrow signal. No acute soft tissue abnormality. IMPRESSION: 1. No acute intracranial abnormality. 2. Mild to moderate chronic small vessel ischemic disease. Electronically signed by: Dasie Hamburg MD 07/03/2024 06:26 PM EST RP Workstation: [id number]   CT ANGIO HEAD NECK W WO CM (CODE STROKE) Result Date: 07/03/2024 EXAM: CTA HEAD AND NECK WITH AND WITHOUT 07/03/2024 04:33:00 PM TECHNIQUE: CTA of the head and neck was performed with and without the administration of intravenous contrast. 75 mL (iohexol  (OMNIPAQUE ) 350 MG/ML injection 75 mL IOHEXOL  350 MG/ML SOLN) was administered. Multiplanar 2D and/or 3D reformatted images are provided for review. Automated exposure control, iterative reconstruction, and/or weight based adjustment of the mA/kV was utilized to reduce the radiation dose to as low as reasonably  achievable. Stenosis of the internal carotid arteries measured using NASCET criteria. COMPARISON: Thyroid  ultrasound 12/26/2023 CLINICAL HISTORY: Neuro deficit, acute, stroke suspected. FINDINGS: CTA NECK: AORTIC ARCH AND ARCH VESSELS: Irregular soft and scattered calcified plaque in the aortic arch and proximal left subclavian artery. No dissection or arterial injury. No significant stenosis of the brachiocephalic artery. CERVICAL CAROTID ARTERIES: Retropharyngeal course of the distal common and proximal internal carotid arteries. No dissection,  arterial injury, or hemodynamically significant stenosis by NASCET criteria. CERVICAL VERTEBRAL ARTERIES: Strongly dominant right vertebral artery without evidence of a significant stenosis or dissection. 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 LUNGS AND MEDIASTINUM: Severe emphysema. 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 No cervical lymphadenopathy. BONES: Moderate cervical spondylosis. CTA HEAD: ANTERIOR CIRCULATION: The intracranial internal carotid arteries are patent with mild atherosclerosis not resulting in a significant stenosis. ACAs and MCAs are patent without evidence of a proximal branch lesion or significant proximal stenosis. No aneurysm. POSTERIOR CIRCULATION: The intracranial vertebral arteries are patent to the basilar. Patent PICA and SCA origins are visualized bilaterally. The basilar artery is widely patent. There are robust posterior communicating arteries bilaterally with hypoplasia of the right P1 segment. Both PCAs are patent without evidence of a significant proximal stenosis. No aneurysm. OTHER: The dural venous sinuses are not assessed due to arterial contrast timing. The findings of no LVO was communicated to  Dr. EMERSON Seals at 4:40pm on 07/03/2024 by secure text page via the [facility] messaging system. IMPRESSION: 1. No large vessel occlusion or significant stenosis in the head or neck. 2. 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 3. Emphysema. Electronically signed by: Dasie Hamburg MD 07/03/2024 04:53 PM EST RP Workstation: [id number]   CT HEAD CODE STROKE WO CONTRAST Result Date: 07/03/2024 EXAM: CT HEAD WITHOUT CONTRAST 07/03/2024 04:26:51 PM TECHNIQUE: CT of the head was performed without the administration of intravenous contrast. Automated exposure control, iterative reconstruction, and/or weight based adjustment of the mA/kV was utilized to reduce the radiation dose to as low as reasonably achievable. COMPARISON: MRI head 07/31/2018. CLINICAL HISTORY: Acute neurological deficit, suspected stroke. Aphasia. FINDINGS: BRAIN AND VENTRICLES: There is no evidence of an acute infarct, intracranial hemorrhage, mass, midline shift, hydrocephalus, or extra-axial fluid collection. Prominent extra-axial CSF spaces over both frontal convexities are similar to the prior MRI and attributed to subarachnoid space enlargement from moderately advanced cerebral atrophy. Cerebral white matter hypodensities are nonspecific but compatible with mild chronic small vessel ischemic disease. Calcified atherosclerosis at the skull base. ORBITS: No acute abnormality. SINUSES: Small right mastoid effusion. Minimal left sphenoid sinus mucosal thickening. SOFT TISSUES AND SKULL: No acute soft tissue abnormality. No skull fracture. [place] Stroke Program Early CT Score (ASPECTS) ----- Ganglionic (caudate, IC, lentiform nucleus, insula, M1-M3): 7 Supraganglionic (M4-M6): 3 Total: 10 These results were communicated to Dr. EMERSON Seals at 4:40 pm on 07/03/2024 by secure text page via the [facility] messaging system. IMPRESSION: 1. No acute intracranial  abnormality. ASPECTS of 10. 2. Mild chronic small vessel ischemic disease. Electronically signed by: Dasie Hamburg MD 07/03/2024 04:44 PM EST RP Workstation: [id number]    Assessment and Plan  Syncope Elevated troponin/ ?NSTEMI Troponin 235.  EKG showing normal sinus rhythm and no acute ischemic changes.  Patient is not able to give any history but resting comfortably and does not appear to be in any pain or distress.  Vital signs stable.  Cardiology consulted and will give further recommendations based on repeat troponin.  Appreciate assistance.  Continue cardiac monitoring.  Echocardiogram ordered.  PE less likely as tachycardia has resolved and not hypoxic.  ?Seizure CT head showing no acute intracranial abnormality.  CTA head and neck negative for LVO.  Brain MRI showing  no acute intracranial abnormality.  Neurology concerned about possible seizure and recommended EEG.  Neurology holding off starting antiepileptics until EEG is done.  EEG ordered, seizure precautions.  Mild leukocytosis Lungs clear on exam and no respiratory symptoms reported.  Afebrile.  UA pending to rule out UTI.  Trend WBC count.  Thrombocytopenia Platelet count currently 103k and was normal on labs 8 months ago.  No signs of bleeding.  Continue to monitor labs.  Elevated transaminases AST almost twice as high as ALT but no history of alcohol use reported.  No GI symptoms reported.  Patient is resting comfortably and has no abdominal tenderness on exam.  Alk phos and T. bili normal.  Hold statin and monitor LFTs.  CKD stage IV Mild hyperkalemia Chronic mild metabolic acidosis Creatinine close to baseline.  Give Lokelma  for mild hyperkalemia and continue to monitor labs.  Addendum 07/03/2024 at 9:24 PM: Informed by RN that patient failed bedside swallow screen.  Will discontinue Lokelma /hold oral meds and SLP eval ordered.  Keep NPO.  Will repeat labs to confirm potassium level and treat accordingly.  Addendum 07/03/2024  at 9:29 PM: Start gentle IV fluids as she will be NPO.  Prediabetes Hemoglobin A1c 5.9 in August 2024, repeat ordered.  Thyroid  nodule CTA head and neck showing an approximately 6.6 cm left thyroid  nodule with substernal extension, previously evaluated by ultrasound and meeting criteria for FNA.  Close outpatient follow-up for further workup.  Alzheimer's dementia Delirium precautions.  Hypertension Given syncope, hold antihypertensives at this time and check orthostatics.  Fall precautions.  Hyperlipidemia Holding statin as above.  DVT prophylaxis: SCDs for now until labs are repeated in the morning to check platelet count Code Status: Full code by default.  Patient does not have capacity for decision-making, no surrogate or prior directive available. Family Communication: No family available at this time. Consults called: Neurology, cardiology Level of care: Progressive Care Unit Admission status: It is my clinical opinion that referral for OBSERVATION is reasonable and necessary in this patient based on the above information provided. The aforementioned taken together are felt to place the patient at high risk for further clinical deterioration. However, it is anticipated that the patient may be medically stable for discharge from the hospital within 24 to 48 hours.  Editha Ram MD [facility]  If 7PM-7AM, please contact night-coverage [email or web address]  07/03/2024, 8:01 PM       [1] No Known Allergies  "

## 2024-07-04 ENCOUNTER — Observation Stay (HOSPITAL_COMMUNITY)

## 2024-07-04 DIAGNOSIS — R569 Unspecified convulsions: Secondary | ICD-10-CM | POA: Diagnosis not present

## 2024-07-04 DIAGNOSIS — R55 Syncope and collapse: Secondary | ICD-10-CM | POA: Diagnosis not present

## 2024-07-04 DIAGNOSIS — N390 Urinary tract infection, site not specified: Secondary | ICD-10-CM | POA: Diagnosis present

## 2024-07-04 DIAGNOSIS — E041 Nontoxic single thyroid nodule: Secondary | ICD-10-CM | POA: Diagnosis present

## 2024-07-04 LAB — COMPREHENSIVE METABOLIC PANEL WITH GFR
ALT: 43 U/L (ref 0–44)
AST: 40 U/L (ref 15–41)
Albumin: 3.4 g/dL — ABNORMAL LOW (ref 3.5–5.0)
Alkaline Phosphatase: 76 U/L (ref 38–126)
Anion gap: 13 (ref 5–15)
BUN: 23 mg/dL (ref 8–23)
CO2: 20 mmol/L — ABNORMAL LOW (ref 22–32)
Calcium: 8 mg/dL — ABNORMAL LOW (ref 8.9–10.3)
Chloride: 108 mmol/L (ref 98–111)
Creatinine, Ser: 1.47 mg/dL — ABNORMAL HIGH (ref 0.44–1.00)
GFR, Estimated: 34 mL/min — ABNORMAL LOW
Glucose, Bld: 112 mg/dL — ABNORMAL HIGH (ref 70–99)
Potassium: 4.1 mmol/L (ref 3.5–5.1)
Sodium: 141 mmol/L (ref 135–145)
Total Bilirubin: 0.4 mg/dL (ref 0.0–1.2)
Total Protein: 6.4 g/dL — ABNORMAL LOW (ref 6.5–8.1)

## 2024-07-04 LAB — CBC
HCT: 45 % (ref 36.0–46.0)
Hemoglobin: 14 g/dL (ref 12.0–15.0)
MCH: 29.3 pg (ref 26.0–34.0)
MCHC: 31.1 g/dL (ref 30.0–36.0)
MCV: 94.1 fL (ref 80.0–100.0)
Platelets: 113 K/uL — ABNORMAL LOW (ref 150–400)
RBC: 4.78 MIL/uL (ref 3.87–5.11)
RDW: 13.2 % (ref 11.5–15.5)
WBC: 10.7 K/uL — ABNORMAL HIGH (ref 4.0–10.5)
nRBC: 0 % (ref 0.0–0.2)

## 2024-07-04 LAB — TROPONIN T, HIGH SENSITIVITY: Troponin T High Sensitivity: 222 ng/L (ref 0–19)

## 2024-07-04 LAB — HEMOGLOBIN A1C
Hgb A1c MFr Bld: 5.7 % — ABNORMAL HIGH (ref 4.8–5.6)
Mean Plasma Glucose: 116.89 mg/dL

## 2024-07-04 MED ORDER — CEPHALEXIN 250 MG/5ML PO SUSR: 500.0000 mg | Freq: Three times a day (TID) | ORAL | 0 refills | Status: AC

## 2024-07-04 NOTE — Progress Notes (Signed)
 " PROGRESS NOTE    Glenda Garcia  FMW:[id number] DOB: [birth year] DOA: 07/03/2024 PCP: Cesario Mutton, MD  Subjective: Patient seen and the emergency room.  Her son is at the bedside.  Patient is returned to baseline.  No chest pain shortness of breath dizziness lightheadedness or seizure activity.  Son is anxious for her to leave.  Patient denies dysuria back pain or suprapubic tenderness.  Urine culture is pending.  Has reliable follow-up.  Told her son that we do not have the urine culture back yet and I do not expect that for at least another 24 to 48 hours.  I did review her chart and looked at her most recent isolates in the [facility] system.  She has had E. coli UTIs that were pansensitive.  I will discharge her on Keflex  as she has tolerated that in the past but told the son if she develops abdominal pain,  fevers,  worsening confusion than baseline, dysuria,  or any other issues of concern to return her to the ER.  Otherwise he will contact her PCP in the next 1 to 2 days to have them follow-up the cultures.  Hospital Course:  07/04/24: Patient's entire hospital course was in the emergency room. She had no further episodes and remained hemodynamically stable and afebrile.  She was seen in consultation by cardiology who felt no further workup was necessary.  MRI of the brain was negative for acute stroke although she did have mild to moderate chronic small vessel ischemic disease.  EEG showed generalized cerebral dysfunction without epileptiform discharge.  Definite seizures.  This was felt to be related to metabolic or toxic causes.  At the time of discharge she was afebrile hemodynamically stable back to her usual mental baseline per her son.   HPI: Glenda Garcia is a 89 y.o. female with medical history significant of Alzheimer's dementia, glomerulonephritis, nephrolithiasis, CKD stage IV, hypertension, hyperlipidemia, prediabetes, vitamin D  deficiency presented to the ED via EMS as code  stroke due to acute onset unresponsiveness followed by slurred speech.  Caregiver reported that as patient was on the commode in the bathroom, she began to lean back and stare off becoming unresponsive for 2 or 3 minutes.  After this, her speech was more slurred.  At her baseline, patient is not able to hold a normal conversation and repeats back what people say.  She uses a walker at home and needs assistance with most ADLs.  In the ED, patient was tachycardic on arrival but heart rate subsequently improved and remainder of vital signs stable.  Labs notable for WBC count 13.5, hemoglobin 15.1, platelet count 103k, potassium 5.1> 5.5, bicarb 20, anion gap 15, glucose 172, BUN 27, creatinine 1.8 (close to baseline), AST 100, ALT 53, alk phos and T. bili normal, ethanol level <15, troponin 235.  EKG showing normal sinus rhythm and no acute ischemic changes.  CT head showing no acute intracranial abnormality.  CTA head and neck negative for LVO.  Brain MRI showing no acute intracranial abnormality.  Neurology concerned about possible seizure and recommended EEG.  Neurology holding off starting antiepileptics until EEG is done.  TRH called to admit.      Assessment and Plan   Syncope Elevated troponin/ ?NSTEMI Troponin 235-->  292--> 222.  Fairly flat. EKG showing normal sinus rhythm and no acute ischemic changes.  Echo ef 60-65%, no wma.Appreciate Cardiology opinion.   ?Seizure CT head showing no acute intracranial abnormality.  CTA head and neck negative  for LVO.  Brain MRI showing no acute intracranial abnormality.  Neurology concerned about possible seizure and recommended EEG.  Neurology holding off starting antiepileptics until EEG is done.  EEG Negative for epileptiform discharges.   Mild leukocytosis Lungs clear on exam and no respiratory symptoms reported.  Afebrile.  UA pending to rule out UTI.  Trend WBC count. - 07/04/24: wbc 13.5--> 10.7. probably stress related.   Thrombocytopenia Platelet  count currently 103k and was normal on labs 8 months ago.  No signs of bleeding.  Continue to monitor labs. 07/04/24: 113, stable. No bleeding, outpatient eval.   Elevated transaminases AST almost twice as high as ALT but no history of alcohol use reported.  No GI symptoms reported.  Patient is resting comfortably and has no abdominal tenderness on exam.  Alk phos and T. bili normal.  Hold statin and monitor LFTs. 07/04/24: transaminases normal this am.   CKD stage IV  Creatinine 1.47 at the time of discharge.  Decreased from 1.80 with fluids.  Mild hyperkalemia - 07/04/24: 4.1 Chronic mild metabolic acidosis Creatinine close to baseline.  Give Lokelma  for mild hyperkalemia and continue to monitor labs.   Addendum 07/03/2024 at 9:24 PM: Informed by RN that patient failed bedside swallow screen.  Will discontinue Lokelma /hold oral meds and SLP eval ordered.  Keep NPO.  Will repeat labs to confirm potassium level and treat accordingly.   Addendum 07/03/2024 at 9:29 PM: Start gentle IV fluids as she will be NPO.   Prediabetes Hemoglobin A1c 5.9 in August 2024, repeat ordered.   Thyroid  nodule CTA head and neck showing an approximately 6.6 cm left thyroid  nodule with substernal extension, previously evaluated by ultrasound and meeting criteria for FNA.  Close outpatient follow-up for further workup.   Alzheimer's dementia Delirium precautions.   Hypertension Given syncope, hold antihypertensives at this time and check orthostatics.  Fall precautions.   Hyperlipidemia Holding statin as above.   DVT prophylaxis: SCDs for now until labs are repeated in the morning to check platelet count Code Status: Full code by default.  Patient does not have capacity for decision-making, no surrogate or prior directive available. Family Communication: No family available at this time. Consults called: Neurology, cardiology Level of care: Progressive Care Unit Admission status: It is my clinical opinion that  referral for OBSERVATION is reasonable and necessary in this patient based on the above information provided. The aforementioned taken together are felt to place the patient at high risk for further clinical deterioration. However, it is anticipated that the patient may be medically stable for discharge from the hospital within 24 to 48 hours.    DVT prophylaxis: SCDs Start: 07/03/24 2107  SCDs   Code Status: Full Code Family Communication: Called by nurse that family wanted to leave AMA but they were willing to wait for me a few minutes.  I discussed with the son at bedside.  He is aware that he will need to follow-up with his primary care provider as our workup had not been complete.  I think is reasonable for her to go home that she now that she feels better as long as they follow-up with her regular physician as an outpatient. Disposition Plan: Home with family Reason for continuing need for hospitalization: Family has expressed desire to leave AMA from [facility] to ER nursing staff this morning.  Objective: Vitals:   07/04/24 0145 07/04/24 0200 07/04/24 0315 07/04/24 0515  BP: 127/65 132/64 135/63 122/70  Pulse: 85 85 84 86  Resp: 18 13 19 16   Temp:  98 F (36.7 C)  98.2 F (36.8 C)  TempSrc:    Oral  SpO2: 94% 100% 95% 99%  Weight:      Height:        Intake/Output Summary (Last 24 hours) at 07/04/2024 0714 Last data filed at 07/04/2024 0010 Gross per 24 hour  Intake 100 ml  Output 200 ml  Net -100 ml   Filed Weights   07/03/24 1623 07/03/24 1651  Weight: 71.2 kg 71.2 kg    Examination:  Physical Exam  Data Reviewed: I have personally reviewed following labs and imaging studies  CBC: Recent Labs  Lab 07/03/24 1624 07/03/24 1625 07/04/24 0218  WBC 13.5*  --  10.7*  NEUTROABS 8.7*  --   --   HGB 15.1* 16.7* 14.0  HCT 48.0* 49.0* 45.0  MCV 95.8  --  94.1  PLT 103*  --  113*   Basic Metabolic Panel: Recent Labs  Lab 07/03/24 1624 07/03/24 1625  07/03/24 2229 07/04/24 0218  NA 136 138  --  141  K 5.1 5.5* 4.8 4.1  CL 102 105  --  108  CO2 20*  --   --  20*  GLUCOSE 172* 169*  --  112*  BUN 27* 42*  --  23  CREATININE 1.82* 1.80*  --  1.47*  CALCIUM 9.0  --   --  8.0*   GFR: Estimated Creatinine Clearance: 22.7 mL/min (A) (by C-G formula based on SCr of 1.47 mg/dL (H)). Liver Function Tests: Recent Labs  Lab 07/03/24 1624 07/04/24 0218  AST 100* 40  ALT 53* 43  ALKPHOS 84 76  BILITOT 0.6 0.4  PROT 7.5 6.4*  ALBUMIN 3.8 3.4*   No results for input(s): LIPASE, AMYLASE in the last 168 hours. No results for input(s): AMMONIA in the last 168 hours. Coagulation Profile: Recent Labs  Lab 07/03/24 1624  INR 1.1   Cardiac Enzymes: No results for input(s): CKTOTAL, CKMB, CKMBINDEX, TROPONINI in the last 168 hours. ProBNP, BNP (last 5 results) No results for input(s): PROBNP, BNP in the last 8760 hours. HbA1C: Recent Labs    07/04/24 0218  HGBA1C 5.7*   CBG: Recent Labs  Lab 07/03/24 1620 07/03/24 2130  GLUCAP 187* 103*   Lipid Profile: No results for input(s): CHOL, HDL, LDLCALC, TRIG, CHOLHDL, LDLDIRECT in the last 72 hours. Thyroid  Function Tests: No results for input(s): TSH, T4TOTAL, FREET4, T3FREE, THYROIDAB in the last 72 hours. Anemia Panel: No results for input(s): VITAMINB12, FOLATE, FERRITIN, TIBC, IRON, RETICCTPCT in the last 72 hours. Sepsis Labs: No results for input(s): PROCALCITON, LATICACIDVEN in the last 168 hours.  No results found for this or any previous visit (from the past 240 hours).   Radiology Studies: MR BRAIN WO CONTRAST Result Date: 07/03/2024 EXAM: MRI BRAIN WITHOUT CONTRAST 07/03/2024 05:49:45 PM TECHNIQUE: Multiplanar multisequence MRI of the head/brain was performed without the administration of intravenous contrast. COMPARISON: Head CT and CTA 07/03/2024 and MRI 07/31/2018. CLINICAL HISTORY: Neuro deficit, acute,  stroke suspected; aphasia. FINDINGS: LIMITATIONS: The examination is mildly motion degraded. BRAIN AND VENTRICLES: There is no evidence of an acute infarct, intracranial hemorrhage, mass, midline shift, hydrocephalus, or extra-axial fluid collection. Patchy T2 hyperintensities in the cerebral white matter and pons are nonspecific but compatible with mild to moderate chronic small vessel ischemic disease, mildly progressed from the prior MRI. Chronic infarcts are noted in the left centrum semiovale and bilateral basal ganglia. There is moderately advanced  cerebral atrophy with prominent expansion of the subarachnoid spaces over the frontal convexities. Hypoplastic left vertebral artery, more fully evaluated on today's CTA. ORBITS: Bilateral cataract extraction. SINUSES AND MASTOIDS: Small right mastoid effusion. Clear paranasal sinuses. BONES AND SOFT TISSUES: Normal marrow signal. No acute soft tissue abnormality. IMPRESSION: 1. No acute intracranial abnormality. 2. Mild to moderate chronic small vessel ischemic disease. Electronically signed by: Dasie Hamburg MD 07/03/2024 06:26 PM EST RP Workstation: [id number]   CT ANGIO HEAD NECK W WO CM (CODE STROKE) Result Date: 07/03/2024 EXAM: CTA HEAD AND NECK WITH AND WITHOUT 07/03/2024 04:33:00 PM TECHNIQUE: CTA of the head and neck was performed with and without the administration of intravenous contrast. 75 mL (iohexol  (OMNIPAQUE ) 350 MG/ML injection 75 mL IOHEXOL  350 MG/ML SOLN) was administered. Multiplanar 2D and/or 3D reformatted images are provided for review. Automated exposure control, iterative reconstruction, and/or weight based adjustment of the mA/kV was utilized to reduce the radiation dose to as low as reasonably achievable. Stenosis of the internal carotid arteries measured using NASCET criteria. COMPARISON: Thyroid  ultrasound 12/26/2023 CLINICAL HISTORY: Neuro deficit, acute, stroke suspected. FINDINGS: CTA NECK: AORTIC ARCH AND ARCH VESSELS: Irregular  soft and scattered calcified plaque in the aortic arch and proximal left subclavian artery. No dissection or arterial injury. No significant stenosis of the brachiocephalic artery. CERVICAL CAROTID ARTERIES: Retropharyngeal course of the distal common and proximal internal carotid arteries. No dissection, arterial injury, or hemodynamically significant stenosis by NASCET criteria. CERVICAL VERTEBRAL ARTERIES: Strongly dominant right vertebral artery without evidence of a significant stenosis or dissection. 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 LUNGS AND MEDIASTINUM: Severe emphysema. 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 No cervical lymphadenopathy. BONES: Moderate cervical spondylosis. CTA HEAD: ANTERIOR CIRCULATION: The intracranial internal carotid arteries are patent with mild atherosclerosis not resulting in a significant stenosis. ACAs and MCAs are patent without evidence of a proximal branch lesion or significant proximal stenosis. No aneurysm. POSTERIOR CIRCULATION: The intracranial vertebral arteries are patent to the basilar. Patent PICA and SCA origins are visualized bilaterally. The basilar artery is widely patent. There are robust posterior communicating arteries bilaterally with hypoplasia of the right P1 segment. Both PCAs are patent without evidence of a significant proximal stenosis. No aneurysm. OTHER: The dural venous sinuses are not assessed due to arterial contrast timing. The findings of no LVO was communicated to Dr. EMERSON Seals at 4:40pm on 07/03/2024 by secure text page via the [facility] messaging system. IMPRESSION: 1. No large vessel occlusion or significant stenosis in the head or neck. 2. Approximately 6.6 cm left thyroid  nodule with substernal  extension, previously evaluated by ultrasound and meeting criteria for FNA. 3. Emphysema. Electronically signed by: Dasie Hamburg MD 07/03/2024 04:53 PM EST RP Workstation: [id number]   CT HEAD CODE STROKE WO CONTRAST Result Date: 07/03/2024 EXAM: CT HEAD WITHOUT CONTRAST 07/03/2024 04:26:51 PM TECHNIQUE: CT of the head was performed without the administration of intravenous contrast. Automated exposure control, iterative reconstruction, and/or weight based adjustment of the mA/kV was utilized to reduce the radiation dose to as low as reasonably achievable. COMPARISON: MRI head 07/31/2018. CLINICAL HISTORY: Acute neurological deficit, suspected stroke. Aphasia. FINDINGS: BRAIN AND VENTRICLES: There is no evidence of an acute infarct, intracranial hemorrhage, mass, midline shift, hydrocephalus, or extra-axial fluid collection. Prominent extra-axial CSF spaces over both frontal convexities are  similar to the prior MRI and attributed to subarachnoid space enlargement from moderately advanced cerebral atrophy. Cerebral white matter hypodensities are nonspecific but compatible with mild chronic small vessel ischemic disease. Calcified atherosclerosis at the skull base. ORBITS: No acute abnormality. SINUSES: Small right mastoid effusion. Minimal left sphenoid sinus mucosal thickening. SOFT TISSUES AND SKULL: No acute soft tissue abnormality. No skull fracture. [place] Stroke Program Early CT Score (ASPECTS) ----- Ganglionic (caudate, IC, lentiform nucleus, insula, M1-M3): 7 Supraganglionic (M4-M6): 3 Total: 10 These results were communicated to Dr. EMERSON Seals at 4:40 pm on 07/03/2024 by secure text page via the [facility] messaging system. IMPRESSION: 1. No acute intracranial abnormality. ASPECTS of 10. 2. Mild chronic small vessel ischemic disease. Electronically signed by: Dasie Hamburg MD 07/03/2024 04:44 PM EST RP Workstation: [id number]    Scheduled Meds: Continuous Infusions:  sodium chloride  75 mL/hr (07/03/24  2228)   cefTRIAXone  (ROCEPHIN )  IV Stopped (07/04/24 0010)     LOS: 0 days   Time spent: 25 minutes  Lonni KANDICE Moose, MD  [facility]  07/04/2024, 7:14 AM   "

## 2024-07-04 NOTE — Discharge Summary (Signed)
 " Physician Discharge Summary   Patient: Glenda Garcia MRN: [id number] DOB: 1934-04-18  Admit date:     07/03/2024  Discharge date: 07/04/2024  Discharge Physician: Lonni KANDICE Moose   PCP: Cesario Mutton, MD   Recommendations at discharge:   Follow-up with PCP in 24 to 48 hours to check on urine culture. PCP to follow up multiple problems as below. Given her age and functional status will need thoughtful consideration given her co morbidities.  Discharge Diagnoses: Principal Problem:   Syncope Active Problems:   Elevated troponin   Leukocytosis   Thrombocytopenia   Elevated transaminase level   UTI (urinary tract infection)   Thyroid  nodule  Resolved Problems:   * No resolved hospital problems. *   Subjective: Patient seen in  the emergency room on the date of discharge.  Her son is at the bedside.  Patient has returned to mental baseline.  No chest pain, shortness of breath, dizziness, lightheadedness, or seizure activity.  Son is anxious for her to leave.  Patient denies dysuria, back pain, or suprapubic tenderness.  Urine culture is pending.  Has reliable follow-up.  Told her son that we do not have the urine culture back yet and I do not expect that for at least another 24 to 48 hours.  I did review her chart and looked at her most recent isolates in the [facility] system.  She has had E. coli UTIs that were pansensitive.  I will discharge her on Keflex  as she has tolerated that in the past but told the son if she develops abdominal pain,  fevers,  worsening confusion than baseline, dysuria,  or any other issues of concern to return her to the ER.  Otherwise he will contact her PCP in the next 1 to 2 days to have them follow-up the cultures.   Hospital Course:   07/04/24: Patient's entire hospital course was in the emergency room. She had no further episodes and remained hemodynamically stable and afebrile.  She was seen in consultation by cardiology who felt no further workup was  necessary.  MRI of the brain was negative for acute stroke although she did have mild to moderate chronic small vessel ischemic disease.  EEG showed generalized cerebral dysfunction without epileptiform discharge.  Definite seizures.  This was felt to be related to metabolic or toxic causes.  At the time of discharge she was afebrile hemodynamically stable back to her usual mental baseline per her son.     HPI: Glenda Garcia is a 89 y.o. female with medical history significant of Alzheimer's dementia, glomerulonephritis, nephrolithiasis, CKD stage IV, hypertension, hyperlipidemia, prediabetes, vitamin D  deficiency presented to the ED via EMS as code stroke due to acute onset unresponsiveness followed by slurred speech.  Caregiver reported that as patient was on the commode in the bathroom, she began to lean back and stare off becoming unresponsive for 2 or 3 minutes.  After this, her speech was more slurred.  At her baseline, patient is not able to hold a normal conversation and repeats back what people say.  She uses a walker at home and needs assistance with most ADLs.  In the ED, patient was tachycardic on arrival but heart rate subsequently improved and remainder of vital signs stable.  Labs notable for WBC count 13.5, hemoglobin 15.1, platelet count 103k, potassium 5.1> 5.5, bicarb 20, anion gap 15, glucose 172, BUN 27, creatinine 1.8 (close to baseline), AST 100, ALT 53, alk phos and T. bili normal, ethanol  level <15, troponin 235.  EKG showing normal sinus rhythm and no acute ischemic changes.  CT head showing no acute intracranial abnormality.  CTA head and neck negative for LVO.  Brain MRI showing no acute intracranial abnormality.  Neurology concerned about possible seizure and recommended EEG.  Neurology holding off starting antiepileptics until EEG is done.  TRH called to admit.        Assessment and Plan   Syncope Elevated troponin/ ?NSTEMI Troponin 235-->  292--> 222.  Fairly flat.  EKG showing normal sinus rhythm and no acute ischemic changes.  May 2,2022  Echo ef 60-65%, no wma. Cardiology has seen patient and recommends no further workup. Echo cancelled for this admission.   ?Seizure CT head showing no acute intracranial abnormality.  CTA head and neck negative for LVO.  Brain MRI showing no acute intracranial abnormality.  Neurology concerned about possible seizure and recommended EEG.  Neurology holding off starting antiepileptics until EEG is done.  EEG Negative for epileptiform discharges.   Mild leukocytosis Lungs clear on exam and no respiratory symptoms reported.  Afebrile.  UA pending to rule out UTI.  Trend WBC count. - 07/04/24: wbc 13.5--> 10.7. probably stress related. Urine Cx will need follow up in 24 -48 hrs. Son aware.   Thrombocytopenia Platelet count currently 103k and was normal on labs 8 months ago.  No signs of bleeding.  Continue to monitor labs. 07/04/24: 113,  platelets remain stable. No bleeding, outpatient eval.   Elevated transaminases AST almost twice as high as ALT but no history of alcohol use reported.  No GI symptoms reported.  Patient is resting comfortably and has no abdominal tenderness on exam.  Alk phos and T. bili normal.  Hold statin and monitor LFTs. 07/04/24: transaminases normal this am.   CKD stage IV   Creatinine 1.47 at the time of discharge.  Decreased from 1.80 with fluids.   Mild hyperkalemia - 07/04/24: 4.1 Chronic mild metabolic acidosis Creatinine close to baseline.  Give Lokelma  for mild hyperkalemia and continue to monitor labs.   Addendum 07/03/2024 at 9:24 PM: Informed by RN that patient failed bedside swallow screen.  Will discontinue Lokelma /hold oral meds and SLP eval ordered.  Keep NPO.  Will repeat labs to confirm potassium level and treat accordingly.   Addendum 07/03/2024 at 9:29 PM: Start gentle IV fluids as she will be NPO.   Prediabetes Hemoglobin A1c 5.9 in August 2024, repeat ordered.   Thyroid   nodule CTA head and neck showing an approximately 6.6 cm left thyroid  nodule with substernal extension, previously evaluated by ultrasound and meeting criteria for FNA.  Close outpatient follow-up for further workup.   Alzheimer's dementia Delirium precautions.   Hypertension Given syncope, hold antihypertensives at this time and check orthostatics.  Fall precautions.   Hyperlipidemia Holding statin as above.   DVT prophylaxis: SCDs for now until labs are repeated in the morning to check platelet count Code Status: Full code by default.  Patient does not have capacity for decision-making, no surrogate or prior directive available. Family Communication: No family available at this time. Consults called: Neurology, cardiology Level of care: Progressive Care Unit Admission status: It is my clinical opinion that referral for OBSERVATION is reasonable and necessary in this patient based on the above information provided. The aforementioned taken together are felt to place the patient at high risk for further clinical deterioration. However, it is anticipated that the patient may be medically stable for discharge from the hospital within 24 to  48 hours.          SCDs   Code Status: Full Code Family Communication: Called by nurse that family wanted to leave AMA but they were willing to wait for me for a few minutes.  I discussed with the son at bedside.  He is aware that he will need to follow-up with her primary care provider as our workup had not been complete.  I think is reasonable for her to go home as she now feels better, as long as they follow-up with her regular physician as an outpatient. Disposition Plan: Home with family Reason for continuing need for hospitalization: Family has expressed desire to leave AMA from [facility] to ER nursing staff this morning.   Objective:       Vitals:    07/04/24 0145 07/04/24 0200 07/04/24 0315 07/04/24 0515  BP: 127/65 132/64 135/63  122/70  Pulse: 85 85 84 86  Resp: 18 13 19 16   Temp:   98 F (36.7 C)   98.2 F (36.8 C)  TempSrc:       Oral  SpO2: 94% 100% 95% 99%  Weight:          Height:              Intake/Output Summary (Last 24 hours) at 07/04/2024 0714 Last data filed at 07/04/2024 0010    Gross per 24 hour  Intake 100 ml  Output 200 ml  Net -100 ml        Filed Weights    07/03/24 1623 07/03/24 1651  Weight: 71.2 kg 71.2 kg      Examination:   Awake, alert, pleasantly demented HEENT: PERRL, EOMI no nystagmus Pharynx clear Cardiac: Regular rate and rhythm S1-S2 no murmurs rubs gallops Lungs are clear throughout with good air entry Abdomen is soft, nontender, nondistended bowel sounds present Extremities: No cyanosis clubbing or edema Neurologic: Answers questions follows commands moves extremities symmetrically generally weak   Data Reviewed: I have personally reviewed following labs and imaging studies   CBC: Last Labs       Recent Labs  Lab 07/03/24 1624 07/03/24 1625 07/04/24 0218  WBC 13.5*  --  10.7*  NEUTROABS 8.7*  --   --   HGB 15.1* 16.7* 14.0  HCT 48.0* 49.0* 45.0  MCV 95.8  --  94.1  PLT 103*  --  113*      Basic Metabolic Panel: Last Labs        Recent Labs  Lab 07/03/24 1624 07/03/24 1625 07/03/24 2229 07/04/24 0218  NA 136 138  --  141  K 5.1 5.5* 4.8 4.1  CL 102 105  --  108  CO2 20*  --   --  20*  GLUCOSE 172* 169*  --  112*  BUN 27* 42*  --  23  CREATININE 1.82* 1.80*  --  1.47*  CALCIUM 9.0  --   --  8.0*      GFR: Estimated Creatinine Clearance: 22.7 mL/min (A) (by C-G formula based on SCr of 1.47 mg/dL (H)). Liver Function Tests: Last Labs      Recent Labs  Lab 07/03/24 1624 07/04/24 0218  AST 100* 40  ALT 53* 43  ALKPHOS 84 76  BILITOT 0.6 0.4  PROT 7.5 6.4*  ALBUMIN 3.8 3.4*      Last Labs  No results for input(s): LIPASE, AMYLASE in the last 168 hours.   Last Labs  No results for input(s): AMMONIA in the  last 168 hours.    Coagulation Profile: Last Labs     Recent Labs  Lab 07/03/24 1624  INR 1.1      Cardiac Enzymes: Last Labs  No results for input(s): CKTOTAL, CKMB, CKMBINDEX, TROPONINI in the last 168 hours.   ProBNP, BNP (last 5 results) Recent Labs (within last 365 days)  No results for input(s): PROBNP, BNP in the last 8760 hours.   HbA1C: Recent Labs (last 2 labs)     Recent Labs    07/04/24 0218  HGBA1C 5.7*      CBG: Last Labs      Recent Labs  Lab 07/03/24 1620 07/03/24 2130  GLUCAP 187* 103*      Lipid Profile: Recent Labs (last 2 labs)  No results for input(s): CHOL, HDL, LDLCALC, TRIG, CHOLHDL, LDLDIRECT in the last 72 hours.   Thyroid  Function Tests: Recent Labs (last 2 labs)  No results for input(s): TSH, T4TOTAL, FREET4, T3FREE, THYROIDAB in the last 72 hours.   Anemia Panel: Recent Labs (last 2 labs)  No results for input(s): VITAMINB12, FOLATE, FERRITIN, TIBC, IRON, RETICCTPCT in the last 72 hours.   Sepsis Labs: Last Labs  No results for input(s): PROCALCITON, LATICACIDVEN in the last 168 hours.     No results found for this or any previous visit (from the past 240 hours).    Radiology Studies: Imaging Results (Last 48 hours)  MR BRAIN WO CONTRAST Result Date: 07/03/2024 EXAM: MRI BRAIN WITHOUT CONTRAST 07/03/2024 05:49:45 PM TECHNIQUE: Multiplanar multisequence MRI of the head/brain was performed without the administration of intravenous contrast. COMPARISON: Head CT and CTA 07/03/2024 and MRI 07/31/2018. CLINICAL HISTORY: Neuro deficit, acute, stroke suspected; aphasia. FINDINGS: LIMITATIONS: The examination is mildly motion degraded. BRAIN AND VENTRICLES: There is no evidence of an acute infarct, intracranial hemorrhage, mass, midline shift, hydrocephalus, or extra-axial fluid collection. Patchy T2 hyperintensities in the cerebral white matter and pons are nonspecific but compatible with mild to moderate  chronic small vessel ischemic disease, mildly progressed from the prior MRI. Chronic infarcts are noted in the left centrum semiovale and bilateral basal ganglia. There is moderately advanced cerebral atrophy with prominent expansion of the subarachnoid spaces over the frontal convexities. Hypoplastic left vertebral artery, more fully evaluated on today's CTA. ORBITS: Bilateral cataract extraction. SINUSES AND MASTOIDS: Small right mastoid effusion. Clear paranasal sinuses. BONES AND SOFT TISSUES: Normal marrow signal. No acute soft tissue abnormality. IMPRESSION: 1. No acute intracranial abnormality. 2. Mild to moderate chronic small vessel ischemic disease. Electronically signed by: Dasie Hamburg MD 07/03/2024 06:26 PM EST RP Workstation: [id number]    CT ANGIO HEAD NECK W WO CM (CODE STROKE) Result Date: 07/03/2024 EXAM: CTA HEAD AND NECK WITH AND WITHOUT 07/03/2024 04:33:00 PM TECHNIQUE: CTA of the head and neck was performed with and without the administration of intravenous contrast. 75 mL (iohexol  (OMNIPAQUE ) 350 MG/ML injection 75 mL IOHEXOL  350 MG/ML SOLN) was administered. Multiplanar 2D and/or 3D reformatted images are provided for review. Automated exposure control, iterative reconstruction, and/or weight based adjustment of the mA/kV was utilized to reduce the radiation dose to as low as reasonably achievable. Stenosis of the internal carotid arteries measured using NASCET criteria. COMPARISON: Thyroid  ultrasound 12/26/2023 CLINICAL HISTORY: Neuro deficit, acute, stroke suspected. FINDINGS: CTA NECK: AORTIC ARCH AND ARCH VESSELS: Irregular soft and scattered calcified plaque in the aortic arch and proximal left subclavian artery. No dissection or arterial injury. No significant stenosis of the brachiocephalic artery. CERVICAL CAROTID ARTERIES: Retropharyngeal course of the distal  common and proximal internal carotid arteries. No dissection, arterial injury, or hemodynamically significant stenosis by  NASCET criteria. CERVICAL VERTEBRAL ARTERIES: Strongly dominant right vertebral artery without evidence of a significant stenosis or dissection. 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 LUNGS AND MEDIASTINUM: Severe emphysema. 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 No cervical lymphadenopathy. BONES: Moderate cervical spondylosis. CTA HEAD: ANTERIOR CIRCULATION: The intracranial internal carotid arteries are patent with mild atherosclerosis not resulting in a significant stenosis. ACAs and MCAs are patent without evidence of a proximal branch lesion or significant proximal stenosis. No aneurysm. POSTERIOR CIRCULATION: The intracranial vertebral arteries are patent to the basilar. Patent PICA and SCA origins are visualized bilaterally. The basilar artery is widely patent. There are robust posterior communicating arteries bilaterally with hypoplasia of the right P1 segment. Both PCAs are patent without evidence of a significant proximal stenosis. No aneurysm. OTHER: The dural venous sinuses are not assessed due to arterial contrast timing. The findings of no LVO was communicated to Dr. EMERSON Seals at 4:40pm on 07/03/2024 by secure text page via the [facility] messaging system. IMPRESSION: 1. No large vessel occlusion or significant stenosis in the head or neck. 2. 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 3. Emphysema. Electronically signed by: Dasie Hamburg MD 07/03/2024 04:53 PM EST RP Workstation: [id number]    CT HEAD CODE STROKE WO CONTRAST Result Date: 07/03/2024 EXAM: CT HEAD WITHOUT CONTRAST 07/03/2024 04:26:51 PM TECHNIQUE: CT of the head was performed without the administration of  intravenous contrast. Automated exposure control, iterative reconstruction, and/or weight based adjustment of the mA/kV was utilized to reduce the radiation dose to as low as reasonably achievable. COMPARISON: MRI head 07/31/2018. CLINICAL HISTORY: Acute neurological deficit, suspected stroke. Aphasia. FINDINGS: BRAIN AND VENTRICLES: There is no evidence of an acute infarct, intracranial hemorrhage, mass, midline shift, hydrocephalus, or extra-axial fluid collection. Prominent extra-axial CSF spaces over both frontal convexities are similar to the prior MRI and attributed to subarachnoid space enlargement from moderately advanced cerebral atrophy. Cerebral white matter hypodensities are nonspecific but compatible with mild chronic small vessel ischemic disease. Calcified atherosclerosis at the skull base. ORBITS: No acute abnormality. SINUSES: Small right mastoid effusion. Minimal left sphenoid sinus mucosal thickening. SOFT TISSUES AND SKULL: No acute soft tissue abnormality. No skull fracture. [place] Stroke Program Early CT Score (ASPECTS) ----- Ganglionic (caudate, IC, lentiform nucleus, insula, M1-M3): 7 Supraganglionic (M4-M6): 3 Total: 10 These results were communicated to Dr. EMERSON Seals at 4:40 pm on 07/03/2024 by secure text page via the [facility] messaging system. IMPRESSION: 1. No acute intracranial abnormality. ASPECTS of 10. 2. Mild chronic small vessel ischemic disease. Electronically signed by: Dasie Hamburg MD 07/03/2024 04:44 PM EST RP Workstation: [id number]        Scheduled Meds:      Continuous Infusions:  sodium chloride  75 mL/hr (07/03/24 2228)   cefTRIAXone  (ROCEPHIN )  IV Stopped (07/04/24 0010)           LOS: 0 days    Time spent: 35 minutes   Lonni KANDICE Moose, MD   [facility]   07/04/2024, 7:14 AM         Pain control - [place]  Controlled Substance Reporting System database was reviewed. and patient was instructed, not to drive, operate heavy  machinery, perform activities at heights, swimming or participation in water activities or provide baby-sitting services while on Pain, Sleep and Anxiety Medications; until their outpatient Physician has advised to do so again. Also recommended to not to take more than prescribed Pain, Sleep and Anxiety Medications.  Consultants: Dr. CHARM Pacini, Cardiology and Dr. EMERSON Seals, Neurology Procedures performed:  Head CT wo contrast, CTA Head & Neck w/wo contrast, MRI brain wo contrast, EEG Disposition: Home Diet recommendation:  Discharge Diet Orders (From admission, onward)     Start     Ordered   07/04/24 0000  Diet general        07/04/24 1138           Regular diet DISCHARGE MEDICATION: Allergies as of 07/04/2024   No Known Allergies      Medication List     TAKE these medications    amLODipine  5 MG tablet Commonly known as: NORVASC  Take 1 tablet (5 mg total) by mouth daily.   cephALEXin  250 MG/5ML suspension Commonly known as: KEFLEX  Take 10 mLs (500 mg total) by mouth 3 (three) times daily for 5 days.   metoprolol  succinate 25 MG 24 hr tablet Commonly known as: TOPROL -XL Take 25 mg by mouth daily.   simvastatin  10 MG tablet Commonly known as: ZOCOR  Take 1 tablet (10 mg total) by mouth daily at 6 PM. To lower cholesterol What changed:  when to take this additional instructions   VITAMIN B-12 PO Take 1 tablet by mouth daily.        Discharge Exam: Filed Weights   07/03/24 1623 07/03/24 1651  Weight: 71.2 kg 71.2 kg     Condition at discharge: stable  The results of significant diagnostics from this hospitalization (including imaging, microbiology, ancillary and laboratory) are listed below for reference.   Imaging Studies: EEG adult Result Date: 07/04/2024 Shelton Arlin KIDD, MD     07/04/2024 10:11 AM Patient Name: Jera B Stirn MRN: [id number] Epilepsy Attending: Arlin KIDD Shelton Referring Physician/Provider: Alfornia Madison, MD Date: 07/04/2024  Duration: 22.42 mins Patient history: 89yo ZF with acute onset of unresponsiveness followed by slurred speech. Level of alertness: Awake AEDs during EEG study: None Technical aspects: This EEG study was done with scalp electrodes positioned according to the 10-20 International system of electrode placement. Electrical activity was reviewed with band pass filter of 1-70Hz , sensitivity of 7 uV/mm, display speed of 47mm/sec with a 60Hz  notched filter applied as appropriate. EEG data were recorded continuously and digitally stored.  Video monitoring was available and reviewed as appropriate. Description: EEG showed continuous generalized 3 to 6 Hz theta-delta slowing. Generalized periodic discharges with triphasic morphology at 1.5-2 Hz were also noted, more prominent when awake/stimulated. Hyperventilation and photic stimulation were not performed.   ABNORMALITY - Periodic discharges with triphasic morphology, generalized ( GPDs) - Continuous slow, generalized IMPRESSION: This study is suggestive of generalized cerebral dysfunction ( encephalopathy).  Generalized periodic discharges with triphasic morphology were noted.  Given the frequency and morphology as well as reactivity to stimulation, these are most likely related to toxic-metabolic causes. No seizures or definite epileptiform discharges were seen throughout the recording. Arlin KIDD Shelton   MR BRAIN WO CONTRAST Result Date: 07/03/2024 EXAM: MRI BRAIN WITHOUT CONTRAST 07/03/2024 05:49:45 PM TECHNIQUE: Multiplanar multisequence MRI of the head/brain was performed without the administration of intravenous contrast. COMPARISON: Head CT and CTA 07/03/2024 and MRI 07/31/2018. CLINICAL HISTORY: Neuro deficit, acute, stroke suspected; aphasia. FINDINGS: LIMITATIONS: The examination is mildly motion degraded. BRAIN AND  VENTRICLES: There is no evidence of an acute infarct, intracranial hemorrhage, mass, midline shift, hydrocephalus, or extra-axial fluid collection.  Patchy T2 hyperintensities in the cerebral white matter and pons are nonspecific but compatible with mild to moderate chronic small vessel ischemic disease, mildly progressed from the prior MRI. Chronic infarcts are noted in the left centrum semiovale and bilateral basal ganglia. There is moderately advanced cerebral atrophy with prominent expansion of the subarachnoid spaces over the frontal convexities. Hypoplastic left vertebral artery, more fully evaluated on today's CTA. ORBITS: Bilateral cataract extraction. SINUSES AND MASTOIDS: Small right mastoid effusion. Clear paranasal sinuses. BONES AND SOFT TISSUES: Normal marrow signal. No acute soft tissue abnormality. IMPRESSION: 1. No acute intracranial abnormality. 2. Mild to moderate chronic small vessel ischemic disease. Electronically signed by: Dasie Hamburg MD 07/03/2024 06:26 PM EST RP Workstation: [id number]   CT ANGIO HEAD NECK W WO CM (CODE STROKE) Result Date: 07/03/2024 EXAM: CTA HEAD AND NECK WITH AND WITHOUT 07/03/2024 04:33:00 PM TECHNIQUE: CTA of the head and neck was performed with and without the administration of intravenous contrast. 75 mL (iohexol  (OMNIPAQUE ) 350 MG/ML injection 75 mL IOHEXOL  350 MG/ML SOLN) was administered. Multiplanar 2D and/or 3D reformatted images are provided for review. Automated exposure control, iterative reconstruction, and/or weight based adjustment of the mA/kV was utilized to reduce the radiation dose to as low as reasonably achievable. Stenosis of the internal carotid arteries measured using NASCET criteria. COMPARISON: Thyroid  ultrasound 12/26/2023 CLINICAL HISTORY: Neuro deficit, acute, stroke suspected. FINDINGS: CTA NECK: AORTIC ARCH AND ARCH VESSELS: Irregular soft and scattered calcified plaque in the aortic arch and proximal left subclavian artery. No dissection or arterial injury. No significant stenosis of the brachiocephalic artery. CERVICAL CAROTID ARTERIES: Retropharyngeal course of the distal common  and proximal internal carotid arteries. No dissection, arterial injury, or hemodynamically significant stenosis by NASCET criteria. CERVICAL VERTEBRAL ARTERIES: Strongly dominant right vertebral artery without evidence of a significant stenosis or dissection. 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 LUNGS AND MEDIASTINUM: Severe emphysema. 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 No cervical lymphadenopathy. BONES: Moderate cervical spondylosis. CTA HEAD: ANTERIOR CIRCULATION: The intracranial internal carotid arteries are patent with mild atherosclerosis not resulting in a significant stenosis. ACAs and MCAs are patent without evidence of a proximal branch lesion or significant proximal stenosis. No aneurysm. POSTERIOR CIRCULATION: The intracranial vertebral arteries are patent to the basilar. Patent PICA and SCA origins are visualized bilaterally. The basilar artery is widely patent. There are robust posterior communicating arteries bilaterally with hypoplasia of the right P1 segment. Both PCAs are patent without evidence of a significant proximal stenosis. No aneurysm. OTHER: The dural venous sinuses are not assessed due to arterial contrast timing. The findings of no LVO was communicated to Dr. EMERSON Seals at 4:40pm on 07/03/2024 by secure text page via the [facility] messaging system. IMPRESSION: 1. No large vessel occlusion or significant stenosis in the head or neck. 2. 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 3. Emphysema. Electronically signed by: Dasie Hamburg MD 07/03/2024 04:53 PM EST RP Workstation: [id number]   CT HEAD CODE STROKE WO CONTRAST Result Date: 07/03/2024 EXAM: CT HEAD  WITHOUT CONTRAST 07/03/2024 04:26:51 PM TECHNIQUE: CT of the head was performed without the administration of intravenous contrast. Automated exposure  control, iterative reconstruction, and/or weight based adjustment of the mA/kV was utilized to reduce the radiation dose to as low as reasonably achievable. COMPARISON: MRI head 07/31/2018. CLINICAL HISTORY: Acute neurological deficit, suspected stroke. Aphasia. FINDINGS: BRAIN AND VENTRICLES: There is no evidence of an acute infarct, intracranial hemorrhage, mass, midline shift, hydrocephalus, or extra-axial fluid collection. Prominent extra-axial CSF spaces over both frontal convexities are similar to the prior MRI and attributed to subarachnoid space enlargement from moderately advanced cerebral atrophy. Cerebral white matter hypodensities are nonspecific but compatible with mild chronic small vessel ischemic disease. Calcified atherosclerosis at the skull base. ORBITS: No acute abnormality. SINUSES: Small right mastoid effusion. Minimal left sphenoid sinus mucosal thickening. SOFT TISSUES AND SKULL: No acute soft tissue abnormality. No skull fracture. [place] Stroke Program Early CT Score (ASPECTS) ----- Ganglionic (caudate, IC, lentiform nucleus, insula, M1-M3): 7 Supraganglionic (M4-M6): 3 Total: 10 These results were communicated to Dr. EMERSON Seals at 4:40 pm on 07/03/2024 by secure text page via the [facility] messaging system. IMPRESSION: 1. No acute intracranial abnormality. ASPECTS of 10. 2. Mild chronic small vessel ischemic disease. Electronically signed by: Dasie Hamburg MD 07/03/2024 04:44 PM EST RP Workstation: [id number]    Microbiology: Results for orders placed or performed in visit on 01/09/20  Novel Coronavirus, NAA ([facility])     Status: None   Collection Time: 01/09/20  8:09 AM   Specimen: Oropharyngeal(OP) collection in vial transport medium   Oropharyngea  Testing  Result Value Ref Range Status   SARS-CoV-2, NAA Not Detected Not Detected  Final    Comment: This nucleic acid amplification test was developed and its performance characteristics determined by [employer]. Nucleic acid amplification tests include RT-PCR and TMA. This test has not been FDA cleared or approved. This test has been authorized by FDA under an Emergency Use Authorization (EUA). This test is only authorized for the duration of time the declaration that circumstances exist justifying the authorization of the emergency use of in vitro diagnostic tests for detection of SARS-CoV-2 virus and/or diagnosis of COVID-19 infection under section 564(b)(1) of the Act, 21 U.S.C. 639aaa-6(a) (1), unless the authorization is terminated or revoked sooner. When diagnostic testing is negative, the possibility of a false negative result should be considered in the context of a patient's recent exposures and the presence of clinical signs and symptoms consistent with COVID-19. An individual without symptoms of COVID-19 and who is not shedding SARS-CoV-2 virus wo uld expect to have a negative (not detected) result in this assay.   SARS-COV-2, NAA 2 DAY TAT     Status: None   Collection Time: 01/09/20  8:09 AM   Oropharyngea  Testing  Result Value Ref Range Status   SARS-CoV-2, NAA 2 DAY TAT Performed  Final    Labs: CBC: Recent Labs  Lab 07/03/24 1624 07/03/24 1625 07/04/24 0218  WBC 13.5*  --  10.7*  NEUTROABS 8.7*  --   --   HGB 15.1* 16.7* 14.0  HCT 48.0* 49.0* 45.0  MCV 95.8  --  94.1  PLT 103*  --  113*   Basic Metabolic Panel: Recent Labs  Lab 07/03/24 1624 07/03/24 1625 07/03/24 2229 07/04/24 0218  NA 136 138  --  141  K 5.1 5.5* 4.8 4.1  CL 102 105  --  108  CO2 20*  --   --  20*  GLUCOSE 172* 169*  --  112*  BUN 27* 42*  --  23  CREATININE 1.82* 1.80*  --  1.47*  CALCIUM 9.0  --   --  8.0*   Liver Function Tests: Recent Labs  Lab 07/03/24 1624 07/04/24 0218  AST 100* 40  ALT 53* 43  ALKPHOS 84 76  BILITOT 0.6 0.4   PROT 7.5 6.4*  ALBUMIN 3.8 3.4*   CBG: Recent Labs  Lab 07/03/24 1620 07/03/24 2130  GLUCAP 187* 103*    Discharge time spent: greater than 30 minutes.  Signed: Lonni KANDICE Moose, MD [facility] 07/04/2024 "

## 2024-07-04 NOTE — Procedures (Addendum)
 Patient Name: Glenda Garcia  MRN: [id number]  Epilepsy Attending: Arlin MALVA Krebs  Referring Physician/Provider: Alfornia Madison, MD  Date: 07/04/2024 Duration: 22.42 mins  Patient history: 89yo ZF with acute onset of unresponsiveness followed by slurred speech. EEG to evaluate for seizure  Level of alertness: Awake  AEDs during EEG study: None  Technical aspects: This EEG study was done with scalp electrodes positioned according to the 10-20 International system of electrode placement. Electrical activity was reviewed with band pass filter of 1-70Hz , sensitivity of 7 uV/mm, display speed of 30mm/sec with a 60Hz  notched filter applied as appropriate. EEG data were recorded continuously and digitally stored.  Video monitoring was available and reviewed as appropriate.  Description: EEG showed continuous generalized 3 to 6 Hz theta-delta slowing. Generalized periodic discharges with triphasic morphology at 1.5-2 Hz were also noted, more prominent when awake/stimulated. Hyperventilation and photic stimulation were not performed.     ABNORMALITY - Periodic discharges with triphasic morphology, generalized ( GPDs) - Continuous slow, generalized  IMPRESSION: This study is suggestive of generalized cerebral dysfunction ( encephalopathy).  Generalized periodic discharges with triphasic morphology were noted.  Given the frequency and morphology as well as reactivity to stimulation, these are most likely related to toxic-metabolic causes. No seizures or definite epileptiform discharges were seen throughout the recording.  Davy Faught O Ward Boissonneault

## 2024-07-04 NOTE — Evaluation (Signed)
 Clinical/Bedside Swallow Evaluation Patient Details  Name: TANARA TURVEY MRN: [id number] Date of Birth: [birth year]  Today's Date: 07/04/2024 Time: SLP Start Time (ACUTE ONLY): 1013 SLP Stop Time (ACUTE ONLY): 1024 SLP Time Calculation (min) (ACUTE ONLY): 11 min  Past Medical History:  Past Medical History:  Diagnosis Date   Arthritis    CKD (chronic kidney disease), stage III (HCC)    Dementia (HCC)    Dyspnea    Glomerulonephritis, chronic    History of kidney stones    Hyperlipidemia    Hypertension    Hypoparathyroidism    pt. denies at preop   Memory loss    Pre-diabetes    hgb a1 c 03-26-19 6.0 epic   Vitamin D  deficiency    Past Surgical History:  Past Surgical History:  Procedure Laterality Date   CYSTOSCOPY WITH RETROGRADE PYELOGRAM, URETEROSCOPY AND STENT PLACEMENT Left 05/17/2019   Procedure: CYSTOSCOPY WITH RETROGRADE PYELOGRAM, URETEROSCOPY AND STENT PLACEMENT;  Surgeon: Matilda Senior, MD;  Location: [facility];  Service: Urology;  Laterality: Left;  1 HR 45 MINS   CYSTOSCOPY/URETEROSCOPY/HOLMIUM LASER/STENT PLACEMENT Left 10/27/2023   Procedure: CYSTOSCOPY/URETEROSCOPY/HOLMIUM LASER/STENT PLACEMENT, and biopsy;  Surgeon: Shane Steffan BROCKS, MD;  Location: [facility];  Service: Urology;  Laterality: Left;   EYE SURGERY     cataract surgery   HOLMIUM LASER APPLICATION Left 05/17/2019   Procedure: HOLMIUM LASER APPLICATION;  Surgeon: Matilda Senior, MD;  Location: [facility];  Service: Urology;  Laterality: Left;   PARTIAL HYSTERECTOMY     REPLACEMENT TOTAL KNEE Left    HPI:  89 y.o.f presented to ED 07/04/23 from home with changes in speech, syncopal episode. MRI negative for acute event. PMHx Alzheimer's Dementia, CKD3, HTN, HLD. No hx of dyphagia per chart review. Failed swallow screen in ED.    Assessment / Plan / Recommendation  Clinical Impression  Pt presents with normal swallowing with low aspiration risk.  Oral mechanism exam normal; wears dentures.   Self-fed crackers, purees, and drank thin liquids with no difficulty, no s/s of aspiration. Son and caregiver at bedside - no hx dysphagia, no current issues. Resume regular diet, thin liquids. No SLP f/u needed. SLP Visit Diagnosis: Dysphagia, unspecified (R13.10)    Aspiration Risk  No limitations    Diet Recommendation   Thin;Age appropriate regular  Medication Administration: Whole meds with liquid    Other Recommendations Oral Care Recommendations: Oral care BID       Swallow Study   General Date of Onset: 07/03/24 HPI: 89 y.o.f presented to ED 07/04/23 from home with changes in speech, syncopal episode. MRI negative for acute event. PMHx Alzheimer's Dementia, CKD3, HTN, HLD. No hx of dyphagia per chart review. Failed swallow screen in ED. Type of Study: Bedside Swallow Evaluation Previous Swallow Assessment: no Diet Prior to this Study: NPO Temperature Spikes Noted: No Respiratory Status: Room air History of Recent Intubation: No Behavior/Cognition: Alert;Cooperative;Pleasant mood;Confused Oral Cavity Assessment: Within Functional Limits Oral Care Completed by SLP: No Oral Cavity - Dentition: Dentures, top Vision: Functional for self-feeding Self-Feeding Abilities: Able to feed self Patient Positioning: Upright in bed Baseline Vocal Quality: Normal Volitional Cough: Cognitively unable to elicit Volitional Swallow: Unable to elicit    Oral/Motor/Sensory Function Overall Oral Motor/Sensory Function: Within functional limits   Ice Chips Ice chips: Within functional limits   Thin Liquid Thin Liquid: Within functional limits    Nectar Thick Nectar Thick Liquid: Not tested   Honey Thick Honey Thick Liquid: Not tested   Puree Puree:  Within functional limits   Solid     Solid: Within functional limits      Vona Palma Laurice 07/04/2024,10:46 AM   Palma L. Vona, MA CCC/SLP Clinical Specialist - Acute Care SLP Acute Rehabilitation Services Office number  [phone]

## 2024-07-04 NOTE — ED Notes (Addendum)
 Pt son came out of room asking about pt being discharged. It was explained to pt that that is a decision made by the doctors and that they were most likely waiting on the results of the EEG like we had discussed earlier and it would take some time to get those results back. He stated he understood. A few moments later he came back out stating he wanted to [facility] with the patient and leave. I explained to him I still needed to speak with the provider and I was trying to contact them and I would let him know when I heard from them. I then received a messaged from the secretary stating that he patient's POA and sister-in-law, Glenda Garcia [phone] would like to speak with me to discharge the patient. She does not wish for her to continue care at [facility], the provider was also alerted of this message as well. Waiting to hear back from provider.

## 2024-07-04 NOTE — Progress Notes (Signed)
" °   07/04/24 1106  TOC Brief Assessment  Insurance and Status Reviewed  Patient has primary care physician Yes  Home environment has been reviewed From home c/son and daughter-in-law  Prior level of function: Min assist  Prior/Current Home Services No current home services  Social Drivers of Health Review SDOH reviewed no interventions necessary  Readmission risk has been reviewed Yes  Transition of care needs no transition of care needs at this time   Current DME: cane, rollator, wc, BSC, shower chair, raised toilet seat c/bars, grab bars in bathroom.   Transition of Care Department [facility]) has reviewed patient and will continue to monitor patient advancement.  If new patient needs arise, please place a TOC consult. "

## 2024-07-04 NOTE — Progress Notes (Signed)
 EEG complete - results pending

## 2024-07-04 NOTE — Care Management Obs Status (Signed)
 MEDICARE OBSERVATION STATUS NOTIFICATION   Patient Details  Name: Glenda Garcia MRN: [id number] Date of Birth: 16-Feb-1934   Medicare Observation Status Notification Given:  Yes    Nena LITTIE Coffee, RN 07/04/2024, 11:06 AM

## 2024-07-04 NOTE — Progress Notes (Signed)
 "  Rounding Note   Patient Name: Glenda Garcia Date of Encounter: 07/04/2024  [facility] Cardiologist: Kate  Subjective  Pt has no complaints.   Scheduled Meds:  Continuous Infusions:  sodium chloride  75 mL/hr (07/03/24 2228)   cefTRIAXone  (ROCEPHIN )  IV Stopped (07/04/24 0010)   PRN Meds:    Vital Signs  Vitals:   07/04/24 0200 07/04/24 0315 07/04/24 0515 07/04/24 0735  BP: 132/64 135/63 122/70 130/64  Pulse: 85 84 86 80  Resp: 13 19 16 18   Temp: 98 F (36.7 C)  98.2 F (36.8 C)   TempSrc:   Oral   SpO2: 100% 95% 99% 96%  Weight:      Height:        Intake/Output Summary (Last 24 hours) at 07/04/2024 0820 Last data filed at 07/04/2024 0010 Gross per 24 hour  Intake 100 ml  Output 200 ml  Net -100 ml      07/03/2024    4:51 PM 07/03/2024    4:23 PM 01/31/2024    8:26 AM  Last 3 Weights  Weight (lbs) 156 lb 15.5 oz 156 lb 15.5 oz 162 lb 14.4 oz  Weight (kg) 71.2 kg 71.2 kg 73.891 kg      Telemetry Sinus - Personally Reviewed  ECG  Sinus, no ischemic changes - Personally Reviewed  Physical Exam  GEN: No acute distress. Only oriented to person Neck: No JVD Cardiac: RRR Respiratory: Clear to auscultation bilaterally. GI: Soft MS: No edema; No deformity. Neuro:  Nonfocal  Psych: Normal affect   Labs High Sensitivity Troponin:  No results for input(s): TROPONINIHS in the last 720 hours.  Recent Labs  Lab 07/03/24 1858 07/03/24 2229 07/04/24 0218  TRNPT 235* 292* 222*       Chemistry Recent Labs  Lab 07/03/24 1624 07/03/24 1625 07/03/24 2229 07/04/24 0218  NA 136 138  --  141  K 5.1 5.5* 4.8 4.1  CL 102 105  --  108  CO2 20*  --   --  20*  GLUCOSE 172* 169*  --  112*  BUN 27* 42*  --  23  CREATININE 1.82* 1.80*  --  1.47*  CALCIUM 9.0  --   --  8.0*  PROT 7.5  --   --  6.4*  ALBUMIN 3.8  --   --  3.4*  AST 100*  --   --  40  ALT 53*  --   --  43  ALKPHOS 84  --   --  76  BILITOT 0.6  --   --  0.4  GFRNONAA 26*  --    --  34*  ANIONGAP 15  --   --  13    Lipids No results for input(s): CHOL, TRIG, HDL, LABVLDL, LDLCALC, CHOLHDL in the last 168 hours.  Hematology Recent Labs  Lab 07/03/24 1624 07/03/24 1625 07/04/24 0218  WBC 13.5*  --  10.7*  RBC 5.01  --  4.78  HGB 15.1* 16.7* 14.0  HCT 48.0* 49.0* 45.0  MCV 95.8  --  94.1  MCH 30.1  --  29.3  MCHC 31.5  --  31.1  RDW 13.4  --  13.2  PLT 103*  --  113*   Thyroid  No results for input(s): TSH, FREET4 in the last 168 hours.  BNPNo results for input(s): BNP, PROBNP in the last 168 hours.  DDimer No results for input(s): DDIMER in the last 168 hours.   Radiology  MR BRAIN WO  CONTRAST Result Date: 07/03/2024 EXAM: MRI BRAIN WITHOUT CONTRAST 07/03/2024 05:49:45 PM TECHNIQUE: Multiplanar multisequence MRI of the head/brain was performed without the administration of intravenous contrast. COMPARISON: Head CT and CTA 07/03/2024 and MRI 07/31/2018. CLINICAL HISTORY: Neuro deficit, acute, stroke suspected; aphasia. FINDINGS: LIMITATIONS: The examination is mildly motion degraded. BRAIN AND VENTRICLES: There is no evidence of an acute infarct, intracranial hemorrhage, mass, midline shift, hydrocephalus, or extra-axial fluid collection. Patchy T2 hyperintensities in the cerebral white matter and pons are nonspecific but compatible with mild to moderate chronic small vessel ischemic disease, mildly progressed from the prior MRI. Chronic infarcts are noted in the left centrum semiovale and bilateral basal ganglia. There is moderately advanced cerebral atrophy with prominent expansion of the subarachnoid spaces over the frontal convexities. Hypoplastic left vertebral artery, more fully evaluated on today's CTA. ORBITS: Bilateral cataract extraction. SINUSES AND MASTOIDS: Small right mastoid effusion. Clear paranasal sinuses. BONES AND SOFT TISSUES: Normal marrow signal. No acute soft tissue abnormality. IMPRESSION: 1. No acute intracranial  abnormality. 2. Mild to moderate chronic small vessel ischemic disease. Electronically signed by: Dasie Hamburg MD 07/03/2024 06:26 PM EST RP Workstation: [id number]   CT ANGIO HEAD NECK W WO CM (CODE STROKE) Result Date: 07/03/2024 EXAM: CTA HEAD AND NECK WITH AND WITHOUT 07/03/2024 04:33:00 PM TECHNIQUE: CTA of the head and neck was performed with and without the administration of intravenous contrast. 75 mL (iohexol  (OMNIPAQUE ) 350 MG/ML injection 75 mL IOHEXOL  350 MG/ML SOLN) was administered. Multiplanar 2D and/or 3D reformatted images are provided for review. Automated exposure control, iterative reconstruction, and/or weight based adjustment of the mA/kV was utilized to reduce the radiation dose to as low as reasonably achievable. Stenosis of the internal carotid arteries measured using NASCET criteria. COMPARISON: Thyroid  ultrasound 12/26/2023 CLINICAL HISTORY: Neuro deficit, acute, stroke suspected. FINDINGS: CTA NECK: AORTIC ARCH AND ARCH VESSELS: Irregular soft and scattered calcified plaque in the aortic arch and proximal left subclavian artery. No dissection or arterial injury. No significant stenosis of the brachiocephalic artery. CERVICAL CAROTID ARTERIES: Retropharyngeal course of the distal common and proximal internal carotid arteries. No dissection, arterial injury, or hemodynamically significant stenosis by NASCET criteria. CERVICAL VERTEBRAL ARTERIES: Strongly dominant right vertebral artery without evidence of a significant stenosis or dissection. 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 LUNGS AND MEDIASTINUM: Severe emphysema. 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 No cervical lymphadenopathy. BONES: Moderate cervical spondylosis. CTA HEAD: ANTERIOR  CIRCULATION: The intracranial internal carotid arteries are patent with mild atherosclerosis not resulting in a significant stenosis. ACAs and MCAs are patent without evidence of a proximal branch lesion or significant proximal stenosis. No aneurysm. POSTERIOR CIRCULATION: The intracranial vertebral arteries are patent to the basilar. Patent PICA and SCA origins are visualized bilaterally. The basilar artery is widely patent. There are robust posterior communicating arteries bilaterally with hypoplasia of the right P1 segment. Both PCAs are patent without evidence of a significant proximal stenosis. No aneurysm. OTHER: The dural venous sinuses are not assessed due to arterial contrast timing. The findings of no LVO was communicated to Dr. EMERSON Seals at 4:40pm on 07/03/2024 by secure text page via the [facility] messaging system. IMPRESSION: 1. No large vessel occlusion or significant stenosis in the head or neck. 2. 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 3. Emphysema. Electronically signed by: Dasie Hamburg MD 07/03/2024 04:53 PM EST RP Workstation: [id number]   CT HEAD CODE STROKE WO CONTRAST Result Date: 07/03/2024 EXAM: CT HEAD WITHOUT CONTRAST 07/03/2024 04:26:51 PM TECHNIQUE: CT of the head was performed without the administration of intravenous contrast. Automated exposure control, iterative reconstruction, and/or weight based adjustment of the mA/kV was utilized to reduce the radiation dose to as low as reasonably achievable. COMPARISON: MRI head 07/31/2018. CLINICAL HISTORY: Acute neurological deficit, suspected stroke. Aphasia. FINDINGS: BRAIN AND VENTRICLES: There is no evidence of an acute infarct, intracranial hemorrhage, mass, midline shift, hydrocephalus, or extra-axial fluid collection. Prominent extra-axial CSF spaces over both frontal convexities are similar to the prior MRI and attributed to subarachnoid  space enlargement from moderately advanced cerebral atrophy. Cerebral white matter hypodensities are nonspecific but compatible with mild chronic small vessel ischemic disease. Calcified atherosclerosis at the skull base. ORBITS: No acute abnormality. SINUSES: Small right mastoid effusion. Minimal left sphenoid sinus mucosal thickening. SOFT TISSUES AND SKULL: No acute soft tissue abnormality. No skull fracture. [place] Stroke Program Early CT Score (ASPECTS) ----- Ganglionic (caudate, IC, lentiform nucleus, insula, M1-M3): 7 Supraganglionic (M4-M6): 3 Total: 10 These results were communicated to Dr. EMERSON Seals at 4:40 pm on 07/03/2024 by secure text page via the [facility] messaging system. IMPRESSION: 1. No acute intracranial abnormality. ASPECTS of 10. 2. Mild chronic small vessel ischemic disease. Electronically signed by: Dasie Hamburg MD 07/03/2024 04:44 PM EST RP Workstation: [id number]    Patient Profile    89 y.o. female with CKD, HTN, HLD, DM and dementia admitted post syncope on the toilet. She has had no chest pain but troponin was checked and mildly elevated.  She has severe dementia. She is not a candidate for invasive cardiac procedures.   Assessment & Plan   Syncope: She has advanced dementia. Will cancel order for echo. We would not change anything based on these findings. Telemetry reviewed and she has been in sinus with no high grade AV block or pauses. No need to check further troponin. Given advanced dementia, would not consider her a candidate for invasive cardiac procedures.   Will sign off. Please call with questions.   For questions or updates, please contact [facility] Please consult [email or web address] for contact info under    Signed, Lonni Cash, MD  07/04/2024, 8:20 AM    "

## 2024-07-06 LAB — URINE CULTURE: Culture: >=100000 — AB

## 2024-07-07 ENCOUNTER — Telehealth (HOSPITAL_BASED_OUTPATIENT_CLINIC_OR_DEPARTMENT_OTHER): Payer: Self-pay | Admitting: *Deleted

## 2024-07-07 NOTE — Telephone Encounter (Signed)
 Post ED Visit - Positive Culture Follow-up  Culture report reviewed by antimicrobial stewardship pharmacist: [facility] []  Rankin Dee, Pharm.D. []  Venetia Gully, Pharm.D., BCPS AQ-ID []  Garrel Crews, Pharm.D., BCPS []  Almarie Lunger, Pharm.D., BCPS []  Benoit, [place].D., BCPS, AAHIVP []  Rosaline Bihari, Pharm.D., BCPS, AAHIVP []  Vernell Meier, PharmD, BCPS []  Latanya Hint, PharmD, BCPS []  Donald Medley, PharmD, BCPS []  Rocky Bold, PharmD []  Dorothyann Alert, PharmD, BCPS [x]  Dorn Poot,  PharmD  [facility] []  Rosaline Edison, PharmD []  Romona Bliss, PharmD []  Dolphus Roller, PharmD []  Veva Seip, Rph []  Vernell Daunt) Leonce, PharmD []  Eva Allis, PharmD []  Rosaline Millet, PharmD []  Iantha Batch, PharmD []  Arvin Gauss, PharmD []  Wanda Hasting, PharmD []  Ronal Rav, PharmD []  Rocky Slade, PharmD []  Bard Jeans, PharmD   Positive urine culture Treated with Cephalexin , organism sensitive to the same and no further patient follow-up is required at this time.  Glenda Garcia 07/07/2024, 11:00 AM
